# Patient Record
Sex: Male | Born: 1970 | Race: Black or African American | Hispanic: No | Smoking: Never smoker
Health system: Southern US, Community
[De-identification: ages and names within clinical notes are randomized; demographics above are authoritative.]

## PROBLEM LIST (undated history)

## (undated) DIAGNOSIS — M109 Gout, unspecified: Secondary | ICD-10-CM

## (undated) DIAGNOSIS — I10 Essential (primary) hypertension: Secondary | ICD-10-CM

---

## 2005-12-24 ENCOUNTER — Emergency Department: Payer: Self-pay | Admitting: Emergency Medicine

## 2006-12-20 ENCOUNTER — Emergency Department: Payer: Self-pay

## 2007-06-16 ENCOUNTER — Emergency Department: Payer: Self-pay | Admitting: Emergency Medicine

## 2009-02-17 ENCOUNTER — Emergency Department: Payer: Self-pay | Admitting: Emergency Medicine

## 2009-06-03 ENCOUNTER — Emergency Department: Payer: Self-pay | Admitting: Emergency Medicine

## 2009-09-04 ENCOUNTER — Emergency Department: Payer: Self-pay | Admitting: Emergency Medicine

## 2009-10-09 ENCOUNTER — Emergency Department: Payer: Self-pay | Admitting: Emergency Medicine

## 2009-11-12 ENCOUNTER — Ambulatory Visit: Payer: Self-pay | Admitting: Orthopedic Surgery

## 2009-11-18 ENCOUNTER — Ambulatory Visit: Payer: Self-pay | Admitting: Orthopedic Surgery

## 2012-01-24 ENCOUNTER — Emergency Department: Payer: Self-pay | Admitting: Emergency Medicine

## 2013-04-14 ENCOUNTER — Emergency Department: Payer: Self-pay | Admitting: Emergency Medicine

## 2013-04-14 LAB — CBC
HCT: 40.5 % (ref 40.0–52.0)
HGB: 13.9 g/dL (ref 13.0–18.0)
MCH: 30.5 pg (ref 26.0–34.0)
MCHC: 34.4 g/dL (ref 32.0–36.0)
RBC: 4.57 10*6/uL (ref 4.40–5.90)
RDW: 13.9 % (ref 11.5–14.5)
WBC: 8.9 10*3/uL (ref 3.8–10.6)

## 2013-04-14 LAB — URINALYSIS, COMPLETE
Bacteria: NONE SEEN
Bilirubin,UR: NEGATIVE
Blood: NEGATIVE
Ketone: NEGATIVE
Protein: NEGATIVE
Specific Gravity: 1.024 (ref 1.003–1.030)
Squamous Epithelial: <1

## 2013-04-15 LAB — COMPREHENSIVE METABOLIC PANEL
Albumin: 3.7 g/dL (ref 3.4–5.0)
Alkaline Phosphatase: 89 U/L (ref 50–136)
Anion Gap: 6 — ABNORMAL LOW (ref 7–16)
EGFR (Non-African Amer.): >60
SGOT(AST): 26 U/L (ref 15–37)
SGPT (ALT): 51 U/L (ref 12–78)

## 2013-04-15 LAB — TROPONIN I: Troponin-I: <0.02 ng/mL

## 2013-04-15 LAB — CK TOTAL AND CKMB (NOT AT ARMC): CK-MB: 0.7 ng/mL (ref 0.5–3.6)

## 2013-04-15 LAB — LIPASE, BLOOD: Lipase: 187 U/L (ref 73–393)

## 2013-06-26 ENCOUNTER — Emergency Department: Payer: Self-pay | Admitting: Emergency Medicine

## 2014-07-06 ENCOUNTER — Emergency Department: Payer: Self-pay | Admitting: Emergency Medicine

## 2014-08-05 ENCOUNTER — Emergency Department: Payer: Self-pay | Admitting: Emergency Medicine

## 2015-01-30 ENCOUNTER — Emergency Department: Payer: Self-pay | Admitting: Emergency Medicine

## 2015-03-03 ENCOUNTER — Emergency Department
Admit: 2015-03-03 | Disposition: A | Discharge status: Home / Self Care | Payer: Self-pay | Admitting: Emergency Medicine

## 2016-01-15 ENCOUNTER — Emergency Department
Admission: EM | Admit: 2016-01-15 | Discharge: 2016-01-15 | Disposition: A | Discharge status: Home / Self Care | Payer: Self-pay | Attending: Emergency Medicine | Admitting: Emergency Medicine

## 2016-01-15 DIAGNOSIS — M109 Gout, unspecified: Secondary | ICD-10-CM

## 2016-01-15 DIAGNOSIS — M10072 Idiopathic gout, left ankle and foot: Secondary | ICD-10-CM | POA: Insufficient documentation

## 2016-01-15 MED ORDER — KETOROLAC TROMETHAMINE 60 MG/2ML IM SOLN
60.0000 mg | Freq: Once | INTRAMUSCULAR | Status: AC
  Administered 2016-01-15: 60 mg via INTRAMUSCULAR

## 2016-01-15 MED ORDER — KETOROLAC TROMETHAMINE 60 MG/2ML IM SOLN
INTRAMUSCULAR | Status: AC
  Filled 2016-01-15: qty 2

## 2016-01-15 MED ORDER — COLCHICINE 0.6 MG PO TABS: 0.6000 mg | ORAL_TABLET | Freq: Every day | ORAL | Status: DC

## 2016-01-15 NOTE — Discharge Instructions (Signed)

## 2016-01-15 NOTE — ED Provider Notes (Signed)
[facility] Emergency Department Provider Note  ____________________________________________  Time seen: Approximately 11:22 PM  I have reviewed the triage vital signs and the nursing notes.   HISTORY  Chief Complaint Foot Pain    HPI Tony Carlson is a 45 y.o. male who presents emergency department complaining of left ankle/foot pain. Patient states that he has a history of gout and states that symptoms are the same. Patient denies any recent injury. Patient denies any knee Pain. He denies any difficulty breathing or chest pain. Patient denies any history of kidney pounds.   No past medical history on file.  There are no active problems to display for this patient.   No past surgical history on file.  Current Outpatient Rx  Name  Route  Sig  Dispense  Refill  . colchicine 0.6 MG tablet   Oral   Take 1 tablet (0.6 mg total) by mouth daily. Take 2 tablets first day, 1 hour later take 1 more tab for a total of 3 tabs on the 1st day Take 1 tab daily for at least 6 more days. If  Symptoms persist past 6 days continue to use until prescription is finished   20 tablet   0     Allergies Review of patient's allergies indicates no known allergies.  No family history on file.  Social History Social History  Substance Use Topics  . Smoking status: Not on file  . Smokeless tobacco: Not on file  . Alcohol Use: Not on file     Review of Systems  Constitutional: No fever/chills Cardiovascular: no chest pain. Respiratory: no cough. No SOB. Musculoskeletal: Negative for back pain. Positive for left ankle pain. Skin: Negative for rash. Neurological: Negative for headaches, focal weakness or numbness. 10-point ROS otherwise negative.  ____________________________________________   PHYSICAL EXAM:  VITAL SIGNS: ED Triage Vitals  Enc Vitals Group     BP 01/15/16 2117 157/97 mmHg     Pulse Rate 01/15/16 2117 108     Resp 01/15/16 2117 18    Temp 01/15/16 2117 97.7 F (36.5 C)     Temp Source 01/15/16 2117 Oral     SpO2 01/15/16 2117 97 %     Weight 01/15/16 2117 250 lb (113.399 kg)     Height 01/15/16 2117  (1.778 m)     Head Cir --      Peak Flow --      Pain Score 01/15/16 2117 7     Pain Loc --      Pain Edu? --      Excl. in GC? --      Constitutional: Alert and oriented. Well appearing and in no acute distress. Eyes: Conjunctivae are normal. PERRL. EOMI. Head: Atraumatic. Cardiovascular: Normal rate, regular rhythm. Normal S1 and S2.  Good peripheral circulation. Respiratory: Normal respiratory effort without tachypnea or retractions. Lungs CTAB. Musculoskeletal: Edematous left ankle upon inspection. Area is not erythematous. It is warm to the touch. No palpable abnormality. Tenderness to palpation diffusely over the ankle. Limited range of motion due to pain. Dorsalis pedis pulses appreciated. Sensation intact 5 digits. Neurologic:  Normal speech and language. No gross focal neurologic deficits are appreciated.  Skin:  Skin is warm, dry and intact. No rash noted. Psychiatric: Mood and affect are normal. Speech and behavior are normal. Patient exhibits appropriate insight and judgement.   ____________________________________________   LABS (all labs ordered are listed, but only abnormal results are displayed)  Labs Reviewed - No  data to display ____________________________________________  EKG   ____________________________________________  RADIOLOGY   No results found.  ____________________________________________    PROCEDURES  Procedure(s) performed:       Medications  ketorolac (TORADOL) injection 60 mg (60 mg Intramuscular Given 01/15/16 2328)     ____________________________________________   INITIAL IMPRESSION / ASSESSMENT AND PLAN / ED COURSE  Pertinent labs & imaging results that were available during my care of the patient were reviewed by me and considered in my  medical decision making (see chart for details).  Patient's diagnosis is consistent with gouty arthritis. Patient presents with a known history of gout and states that symptoms are exactly the same as previous flares. Patient is offered uric acid testing as well as x-ray and patient declines at this time. Patient is given a shot of Toradol here in the emergency department and discharged home with anti-inflammatories for symptom control. Patient will follow-up with his primary care provider for any symptoms persisting past this treatment course.  Patient is given ED precautions to return to the ED for any worsening or new symptoms.     ____________________________________________  FINAL CLINICAL IMPRESSION(S) / ED DIAGNOSES  Final diagnoses:  Acute gout of left ankle, unspecified cause      NEW MEDICATIONS STARTED DURING THIS VISIT:  New Prescriptions   COLCHICINE 0.6 MG TABLET    Take 1 tablet (0.6 mg total) by mouth daily. Take 2 tablets first day, 1 hour later take 1 more tab for a total of 3 tabs on the 1st day Take 1 tab daily for at least 6 more days. If  Symptoms persist past 6 days continue to use until prescription is finished        This chart was dictated using voice recognition software/Dragon. Despite best efforts to proofread, errors can occur which can change the meaning. Any change was purely unintentional.    Racheal PatchesJonathan D Cuthriell, PA-C 01/15/16 2332  Jennye MoccasinBrian S Quigley, MD 01/15/16 [phone]

## 2016-01-15 NOTE — ED Notes (Signed)
Pt placed on med hold at this time for 15 mins, pt made aware and verbalized understanding at this time.

## 2016-01-15 NOTE — ED Notes (Signed)
Pt in with co left foot pain has hx of gout states feels the same.

## 2016-05-26 ENCOUNTER — Emergency Department
Admission: EM | Admit: 2016-05-26 | Discharge: 2016-05-26 | Disposition: A | Discharge status: Home / Self Care | Payer: Self-pay | Attending: Student | Admitting: Student

## 2016-05-26 ENCOUNTER — Encounter: Payer: Self-pay | Admitting: Emergency Medicine

## 2016-05-26 ENCOUNTER — Emergency Department: Payer: Self-pay

## 2016-05-26 DIAGNOSIS — S8002XA Contusion of left knee, initial encounter: Secondary | ICD-10-CM

## 2016-05-26 DIAGNOSIS — S8392XA Sprain of unspecified site of left knee, initial encounter: Secondary | ICD-10-CM | POA: Insufficient documentation

## 2016-05-26 DIAGNOSIS — Y929 Unspecified place or not applicable: Secondary | ICD-10-CM | POA: Insufficient documentation

## 2016-05-26 DIAGNOSIS — Y999 Unspecified external cause status: Secondary | ICD-10-CM | POA: Insufficient documentation

## 2016-05-26 DIAGNOSIS — Y939 Activity, unspecified: Secondary | ICD-10-CM | POA: Insufficient documentation

## 2016-05-26 DIAGNOSIS — W109XXA Fall (on) (from) unspecified stairs and steps, initial encounter: Secondary | ICD-10-CM | POA: Insufficient documentation

## 2016-05-26 MED ORDER — NAPROXEN 500 MG PO TBEC: 500.0000 mg | DELAYED_RELEASE_TABLET | Freq: Two times a day (BID) | ORAL | Status: DC

## 2016-05-26 NOTE — ED Notes (Signed)
Pt presents with left knee pain after falling this past weekend. Pt ambulated to triage with no difficulty noted.

## 2016-05-26 NOTE — ED Provider Notes (Signed)
[facility] Emergency Department Provider Note ____________________________________________  Time seen: 1545  I have reviewed the triage vital signs and the nursing notes.  HISTORY  Chief Complaint  Knee Pain  HPI Tony Carlson is a 45 y.o. male sensitivity ED for evaluation of medial left knee pain [place]. The patient describes that 3 days prior to arrival he was wearing flip flops and rainy weather, when he evidently slipped at the hotel going up steps. He describes falling, twisting his left knee, and hitting the inside of the knee on the concrete steps. He describes pain and swelling to the knee medially, since that time. He denies any catch, click, LOC, or give way. He does note increased pain to the medial knee with transitioning from sit to stand. He has applied some ice intermittently since the accident. He however has not taken any medication for pain relief. He describes overall pain at a 7/10 in triage. He denies any other injury at this time.  History reviewed. No pertinent past medical history.  There are no active problems to display for this patient.   History reviewed. No pertinent past surgical history.  Current Outpatient Rx  Name  Route  Sig  Dispense  Refill  . colchicine 0.6 MG tablet   Oral   Take 1 tablet (0.6 mg total) by mouth daily. Take 2 tablets first day, 1 hour later take 1 more tab for a total of 3 tabs on the 1st day Take 1 tab daily for at least 6 more days. If  Symptoms persist past 6 days continue to use until prescription is finished   20 tablet   0   . naproxen (EC NAPROSYN) 500 MG EC tablet   Oral   Take 1 tablet (500 mg total) by mouth 2 (two) times daily with a meal.   30 tablet   0     Allergies Review of patient's allergies indicates no known allergies.  No family history on file.  Social History Social History  Substance Use Topics  . Smoking status: Never Smoker    . Smokeless tobacco: None  . Alcohol Use: Yes   Review of Systems  Constitutional: Negative for fever. Musculoskeletal: Negative for back pain. Left knee pain as above.  Skin: Negative for rash. Neurological: Negative for headaches, focal weakness or numbness. ____________________________________________  PHYSICAL EXAM:  VITAL SIGNS: ED Triage Vitals  Enc Vitals Group     BP 05/26/16 1511 165/99 mmHg     Pulse Rate 05/26/16 1511 109     Resp 05/26/16 1511 20     Temp 05/26/16 1511 98.1 F (36.7 C)     Temp Source 05/26/16 1511 Oral     SpO2 05/26/16 1511 97 %     Weight 05/26/16 1511 240 lb (108.863 kg)     Height 05/26/16 1511  (1.778 m)     Head Cir --      Peak Flow --      Pain Score 05/26/16 1512 7     Pain Loc --      Pain Edu? --      Excl. in GC? --    Constitutional: Alert and oriented. Well appearing and in no distress. Head: Normocephalic and atraumatic. Cardiovascular: Normal distal pulses.  Respiratory: Normal respiratory effort. No wheezes/rales/rhonchi. Musculoskeletal: Left knee without obvious deformity, effusion, or dislocation. Medial knee tenderness. No valgus or varus joint laxity. Negative anterior/posterior drawer. No popliteal space  fullness. No calf/achillles tenderness. Nontender with normal range of motion in all extremities.  Neurologic:  Mildly antalgic gait without ataxia. Normal speech and language. No gross focal neurologic deficits are appreciated. Skin:  Skin is warm, dry and intact. No rash noted. ____________________________________________   RADIOLOGY  Left Knee IMPRESSION: Normal left knee. ____________________________________________  PROCEDURES  Ace bandage ____________________________________________  INITIAL IMPRESSION / ASSESSMENT AND PLAN / ED COURSE  A short with what appears to be a medial knee sprain and contusion. No outright internal derangement is appreciated on exam. Patient will be fitted with an Ace  bandage for support. He is discharged with a prescription for EC Naprosyn to dose as directed. He should apply ice and rest and elevate if needed. He will follow-up with orthopedics for ongoing symptom management. ____________________________________________  FINAL CLINICAL IMPRESSION(S) / ED DIAGNOSES  Final diagnoses:  Knee sprain and strain, left, initial encounter  Knee contusion, left, initial encounter     Lissa HoardJenise V Bacon Sherelle Castelli, PA-C 05/27/16 0009  Gayla DossEryka A Gayle, MD 05/27/16 1556

## 2016-05-26 NOTE — Discharge Instructions (Signed)
Your knee exam and x-ray do not show a fracture or dislocation to the left knee. You likely have a sprain and bruise to the bone from your injury. Wear the knee brace for support. Apply ice to reduce pain and swelling. Take the prescription anti-inflammatory as directed. Follow-up with Dr. Ernest PineHooten for continued symptoms.   Knee Sprain A knee sprain is a tear in one of the strong, fibrous tissues that connect the bones (ligaments) in your knee. The severity of the sprain depends on how much of the ligament is torn. The tear can be either partial or complete. CAUSES  Often, sprains are a result of a fall or injury. The force of the impact causes the fibers of your ligament to stretch too much. This excess tension causes the fibers of your ligament to tear. SIGNS AND SYMPTOMS  You may have some loss of motion in your knee. Other symptoms include:  Bruising.  Pain in the knee area.  Tenderness of the knee to the touch.  Swelling. DIAGNOSIS  To diagnose a knee sprain, your health care provider will physically examine your knee. Your health care provider may also suggest an X-ray exam of your knee to make sure no bones are broken. TREATMENT  If your ligament is only partially torn, treatment usually involves keeping the knee in a fixed position (immobilization) or bracing your knee for activities that require movement for several weeks. To do this, your health care provider will apply a bandage, cast, or splint to keep your knee from moving and to support your knee during movement until it heals. For a partially torn ligament, the healing process usually takes 4-6 weeks. If your ligament is completely torn, depending on which ligament it is, you may need surgery to reconnect the ligament to the bone or reconstruct it. After surgery, a cast or splint may be applied and will need to stay on your knee for 4-6 weeks while your ligament heals. HOME CARE INSTRUCTIONS  Keep your injured knee elevated to  decrease swelling.  To ease pain and swelling, apply ice to the injured area:  Put ice in a plastic bag.  Place a towel between your skin and the bag.  Leave the ice on for 20 minutes, 2-3 times a day.  Only take medicine for pain as directed by your health care provider.  Do not leave your knee unprotected until pain and stiffness go away (usually 4-6 weeks).  If you have a cast or splint, do not allow it to get wet. If you have been instructed not to remove it, cover it with a plastic bag when you shower or bathe. Do not swim.  Your health care provider may suggest exercises for you to do during your recovery to prevent or limit permanent weakness and stiffness. SEEK IMMEDIATE MEDICAL CARE IF:  Your cast or splint becomes damaged.  Your pain becomes worse.  You have significant pain, swelling, or numbness below the cast or splint. MAKE SURE YOU:  Understand these instructions.  Will watch your condition.  Will get help right away if you are not doing well or get worse.   This information is not intended to replace advice given to you by your health care provider. Make sure you discuss any questions you have with your health care provider.   Document Released: 10/26/2005 Document Revised: 11/16/2014 Document Reviewed: 06/07/2013 Elsevier Interactive Patient Education 2016 [employer]  Periosteal Hematoma Periosteal hematoma (bone bruise) is a localized, tender, raised area close to  the bone. It can occur from a small hidden fracture of the bone, following surgery, or from other trauma to the area. It typically occurs in bones located close to the surface of the skin, such as the shin, knee, and heel bone. Although it may take 2 or more weeks to completely heal, bone bruises typically are not associated with permanent or serious damage to the bone. If you are taking blood thinners, you may be at greater risk for such injuries.  CAUSES  A bone bruise is usually caused by  high-impact trauma to the bone, but it can be caused by sports injuries or twisting injuries. SIGNS AND SYMPTOMS   Severe pain around the injured area that typically lasts longer than a normal bruise.  Difficulty using the bruised area.  Tender, raised area close to the bone.  Discoloration or swelling of the bruised area. DIAGNOSIS  You may need an MRI of the injured area to confirm a bone bruise if your health care provider feels it is necessary. A regular X-ray will not detect a bone bruise, but it will detect a broken bone (fracture). An X-ray may be taken to rule out any fractures. TREATMENT  Often, the best treatment for a bone bruise is resting, icing, and applying cold compresses to the injured area. Over-the-counter medicines may also be recommended for pain control. HOME CARE INSTRUCTIONS  Some things you can do to improve the condition are:   Rest and elevate the area of injury as long as it is very tender or swollen.  Apply ice to the injured area:  Put ice in a plastic bag.  Place a towel between your skin and the bag.  Leave the ice on for 20 minutes, 2-3 times a day.  Use an elastic wrap to reduce swelling and protect the injured area. Make sure it is not applied too tightly. If the area around the wrap becomes cold or blue, the wrap is too tight. Wrap it more loosely.  For activity:  Follow your health care provider's instructions about whether walking with crutches is required. This will depend on how serious your condition is.  Start weight bearing gradually on the bruised part.  Continue to use crutches or a cane until you can stand without causing pain, or as instructed.  If a plaster splint was applied:  Wear the splint until you are seen for a follow-up exam.  Rest it on nothing harder than a pillow the first 24 hours.  Do not put weight on it.  Do not get it wet. You may take it off to take a shower or bath.  You may have been given an elastic  bandage to use with or without the plaster splint. The splint is too tight if you have numbness or tingling, or if the skin around the bandage becomes cold and blue. Adjust the bandage to make it comfortable.  If an air splint was applied:  You may alter the amount of air in the splint as needed for comfort.  You may take it off at night and to take a shower or bath.  If the injury was in either leg, wiggle your toes in the splint several times per day if you are able.  Only take over-the-counter or prescription medicines for pain, discomfort, or fever as directed by your health care provider.  Keep all follow-up visits with your health care provider. This includes any orthopedic referrals, physical therapy, and rehabilitation. Any delay in getting necessary care  could result in a delay or failure of the bones to heal. SEEK MEDICAL CARE IF:   You have an increase in bruising, swelling, tenderness, heat, or pain over your injury.  You notice coldness of your toes that does not improve after removing a splint or bandage.  Your pain is not lessened after you take medicine.  You have increased difficulty bearing weight on the injured leg, if the injury is in either leg. SEEK IMMEDIATE MEDICAL CARE IF:   You have severe pain near the injured area or severe pain with stretching.  You have increased swelling that resulted in a tense, hard area or a loss of sensation in the area of the injury.  You have pale, cool skin below the area of the injury (in an extremity) that does not go away after removing a splint or bandage. MAKE SURE YOU:   Understand these instructions.  Will watch your condition.  Will get help right away if you are not doing well or get worse.   This information is not intended to replace advice given to you by your health care provider. Make sure you discuss any questions you have with your health care provider.   Document Released: 12/03/2004 Document Revised:  08/16/2013 Document Reviewed: 04/14/2013 Elsevier Interactive Patient Education [employer]

## 2016-10-28 ENCOUNTER — Encounter: Payer: Self-pay | Admitting: *Deleted

## 2016-10-28 ENCOUNTER — Emergency Department
Admission: EM | Admit: 2016-10-28 | Discharge: 2016-10-28 | Disposition: A | Discharge status: Home / Self Care | Payer: No Typology Code available for payment source | Attending: Emergency Medicine | Admitting: Emergency Medicine

## 2016-10-28 DIAGNOSIS — M10072 Idiopathic gout, left ankle and foot: Secondary | ICD-10-CM | POA: Diagnosis not present

## 2016-10-28 DIAGNOSIS — M25572 Pain in left ankle and joints of left foot: Secondary | ICD-10-CM | POA: Diagnosis present

## 2016-10-28 DIAGNOSIS — M109 Gout, unspecified: Secondary | ICD-10-CM

## 2016-10-28 MED ORDER — ALLOPURINOL 100 MG PO TABS: 100.0000 mg | ORAL_TABLET | Freq: Every day | ORAL | 0 refills | Status: DC

## 2016-10-28 NOTE — ED Provider Notes (Signed)
[facility] Emergency Department Provider Note  ____________________________________________  Time seen: Approximately 4:53 PM  I have reviewed the triage vital signs and the nursing notes.   HISTORY  Chief Complaint Foot Pain    HPI Tony Carlson is a 45 y.o. male who presents to emergency department complaining of gout to the left ankle. Patient has a history of reoccurring gout. Patient states that he had a few leftover pills from a previous prescription of cultures in that he is taking. These are less than symptoms somewhat but not fully alleviated all symptoms. Patient states that he typically likes allopurinol versus colchicine and is requesting follow-up urine on this time. No other complaints at this time. Pain is sharp, burning to the left ankle with mild edema. No definitive injury.   History reviewed. No pertinent past medical history.  There are no active problems to display for this patient.   History reviewed. No pertinent surgical history.  Prior to Admission medications   Medication Sig Start Date End Date Taking? Authorizing Provider  allopurinol (ZYLOPRIM) 100 MG tablet Take 1 tablet (100 mg total) by mouth daily. May increase to 2 tablets daily after 1 week if symptoms have not resolved. 10/28/16 10/28/17  Christiane HaJonathan D Tateanna Bach, PA-C  colchicine 0.6 MG tablet Take 1 tablet (0.6 mg total) by mouth daily. Take 2 tablets first day, 1 hour later take 1 more tab for a total of 3 tabs on the 1st day Take 1 tab daily for at least 6 more days. If  Symptoms persist past 6 days continue to use until prescription is finished 01/15/16   Delorise RoyalsJonathan D Tylor Gambrill, PA-C  naproxen (EC NAPROSYN) 500 MG EC tablet Take 1 tablet (500 mg total) by mouth 2 (two) times daily with a meal. 05/26/16   Charlesetta IvoryJenise V Bacon Menshew, PA-C    Allergies Patient has no known allergies.  History reviewed. No pertinent family history.  Social History Social History  Substance  Use Topics  . Smoking status: Never Smoker  . Smokeless tobacco: Not on file  . Alcohol use Yes     Review of Systems  Constitutional: No fever/chills Cardiovascular: no chest pain. Respiratory: no cough. No SOB. Musculoskeletal: Left ankle pain Skin: Negative for rash, abrasions, lacerations, ecchymosis. Neurological: Negative for headaches, focal weakness or numbness. 10-point ROS otherwise negative.  ____________________________________________   PHYSICAL EXAM:  VITAL SIGNS: ED Triage Vitals [10/28/16 1644]  Enc Vitals Group     BP (!) 145/78     Pulse Rate (!) 102     Resp 18     Temp 97.9 F (36.6 C)     Temp Source Oral     SpO2 97 %     Weight 240 lb (108.9 kg)     Height 5\' 10"  (1.778 m)     Head Circumference      Peak Flow      Pain Score 5     Pain Loc      Pain Edu?      Excl. in GC?      Constitutional: Alert and oriented. Well appearing and in no acute distress. Eyes: Conjunctivae are normal. PERRL. EOMI. Head: Atraumatic. Neck: No stridor.    Cardiovascular: Normal rate, regular rhythm. Normal S1 and S2.  Good peripheral circulation. Respiratory: Normal respiratory effort without tachypnea or retractions. Lungs CTAB. Good air entry to the bases with no decreased or absent breath sounds. Musculoskeletal: Full range of motion to all extremities. No gross deformities  appreciated. Neurologic:  Normal speech and language. No gross focal neurologic deficits are appreciated. Mild edema noted to left ankle upon inspection. Mild warmth to palpation. No palpable abnormalities. Full range of motion of ankle. Dorsalis pedis pulse intact. Sensation intact all 5 digits. Skin:  Skin is warm, dry and intact. No rash noted. Psychiatric: Mood and affect are normal. Speech and behavior are normal. Patient exhibits appropriate insight and judgement.   ____________________________________________   LABS (all labs ordered are listed, but only abnormal results are  displayed)  Labs Reviewed - No data to display ____________________________________________  EKG   ____________________________________________  RADIOLOGY   No results found.  ____________________________________________    PROCEDURES  Procedure(s) performed:    Procedures    Medications - No data to display   ____________________________________________   INITIAL IMPRESSION / ASSESSMENT AND PLAN / ED COURSE  Pertinent labs & imaging results that were available during my care of the patient were reviewed by me and considered in my medical decision making (see chart for details).  Review of the [place] CSRS was performed in accordance of the [facility] prior to dispensing any controlled drugs.  Clinical Course     Patient's diagnosis is consistent with Gout to the left ankle. Patient is offered uric acid testing and x-ray at this time for further evaluation and patient declines. Patient states that the symptoms are consistent with previous scalp. Patient is requesting allopurinol as he states this works better than colchicine for him.. Patient will be discharged home with prescriptions for allopurinol. Patient is to follow up with primary care as needed or otherwise directed. Patient is given ED precautions to return to the ED for any worsening or new symptoms.     ____________________________________________  FINAL CLINICAL IMPRESSION(S) / ED DIAGNOSES  Final diagnoses:  Acute gout of left ankle, unspecified cause      NEW MEDICATIONS STARTED DURING THIS VISIT:  Discharge Medication List as of 10/28/2016  5:02 PM    START taking these medications   Details  allopurinol (ZYLOPRIM) 100 MG tablet Take 1 tablet (100 mg total) by mouth daily. May increase to 2 tablets daily after 1 week if symptoms have not resolved., Starting Wed 10/28/2016, Until Thu 10/28/2017, Print            This chart was dictated using voice recognition software/Dragon. Despite best  efforts to proofread, errors can occur which can change the meaning. Any change was purely unintentional.    Racheal PatchesJonathan D Orlena Garmon, PA-C 10/28/16 1707    Loleta Roseory Forbach, MD 10/28/16 1820

## 2016-10-28 NOTE — ED Triage Notes (Signed)
Pt arrives with complaints of left foot pain, denies any injury, states hx of gout

## 2017-03-01 ENCOUNTER — Emergency Department
Admission: EM | Admit: 2017-03-01 | Discharge: 2017-03-01 | Disposition: A | Discharge status: Home / Self Care | Payer: No Typology Code available for payment source | Attending: Emergency Medicine | Admitting: Emergency Medicine

## 2017-03-01 ENCOUNTER — Encounter: Payer: Self-pay | Admitting: Emergency Medicine

## 2017-03-01 ENCOUNTER — Emergency Department: Payer: No Typology Code available for payment source

## 2017-03-01 DIAGNOSIS — M109 Gout, unspecified: Secondary | ICD-10-CM

## 2017-03-01 DIAGNOSIS — M10061 Idiopathic gout, right knee: Secondary | ICD-10-CM | POA: Insufficient documentation

## 2017-03-01 DIAGNOSIS — M25561 Pain in right knee: Secondary | ICD-10-CM | POA: Diagnosis present

## 2017-03-01 LAB — COMPREHENSIVE METABOLIC PANEL
ALT: 32 U/L (ref 17–63)
AST: 23 U/L (ref 15–41)
Albumin: 4.5 g/dL (ref 3.5–5.0)
Alkaline Phosphatase: 75 U/L (ref 38–126)
Anion gap: 7 (ref 5–15)
BUN: 18 mg/dL (ref 6–20)
CO2: 27 mmol/L (ref 22–32)
Calcium: 9.8 mg/dL (ref 8.9–10.3)
Chloride: 102 mmol/L (ref 101–111)
Creatinine, Ser: 1.03 mg/dL (ref 0.61–1.24)
GFR calc Af Amer: >60 mL/min (ref >60)
GFR calc non Af Amer: >60 mL/min (ref >60)
Glucose, Bld: 107 mg/dL — ABNORMAL HIGH (ref 65–99)
Potassium: 4 mmol/L (ref 3.5–5.1)
Sodium: 136 mmol/L (ref 135–145)
Total Bilirubin: 0.5 mg/dL (ref 0.3–1.2)
Total Protein: 8.8 g/dL — ABNORMAL HIGH (ref 6.5–8.1)

## 2017-03-01 LAB — CBC
HCT: 46.3 % (ref 40.0–52.0)
HEMOGLOBIN: 15.9 g/dL (ref 13.0–18.0)
MCH: 30.3 pg (ref 26.0–34.0)
MCHC: 34.3 g/dL (ref 32.0–36.0)
MCV: 88.4 fL (ref 80.0–100.0)
Platelets: 286 10*3/uL (ref 150–440)
RBC: 5.24 MIL/uL (ref 4.40–5.90)
RDW: 13.5 % (ref 11.5–14.5)
WBC: 12.4 10*3/uL — ABNORMAL HIGH (ref 3.8–10.6)

## 2017-03-01 LAB — URIC ACID: Uric Acid, Serum: 7.6 mg/dL (ref 4.4–7.6)

## 2017-03-01 LAB — C-REACTIVE PROTEIN: CRP: 7.6 mg/dL — ABNORMAL HIGH (ref <1.0)

## 2017-03-01 LAB — SEDIMENTATION RATE: Sed Rate: 11 mm/hr (ref 0–15)

## 2017-03-01 MED ORDER — HYDROCODONE-ACETAMINOPHEN 5-325 MG PO TABS: 1.0000 | ORAL_TABLET | Freq: Four times a day (QID) | ORAL | 0 refills | Status: DC | PRN

## 2017-03-01 MED ORDER — INDOMETHACIN 25 MG PO CAPS: 25.0000 mg | ORAL_CAPSULE | Freq: Two times a day (BID) | ORAL | 0 refills | Status: DC

## 2017-03-01 NOTE — ED Provider Notes (Signed)
[facility] Emergency Department Provider Note  ____________________________________________  Time seen: Approximately 4:05 PM  I have reviewed the triage vital signs and the nursing notes.   HISTORY  Chief Complaint Knee Pain    HPI ANTONEO GHRIST is a 46 y.o. male that presents to emergency department with 3 days of right knee pain. Patient states that overnight knee became hot and swollen. No injury. He has a history of gout and is supposed to take Medicine daily but has not been. He started taking colchicine, which has not been working. He denies any recent illness. No injuries. No IV drug use. He has never had surgery on that knee. He has been taking ibuprofen for pain. He denies fever, shortness of breath, chest pain, nausea, vomiting, abdominal pain.   History reviewed. No pertinent past medical history.  There are no active problems to display for this patient.   History reviewed. No pertinent surgical history.  Prior to Admission medications   Medication Sig Start Date End Date Taking? Authorizing Provider  allopurinol (ZYLOPRIM) 100 MG tablet Take 1 tablet (100 mg total) by mouth daily. May increase to 2 tablets daily after 1 week if symptoms have not resolved. 10/28/16 10/28/17  Christiane Ha D Cuthriell, PA-C  colchicine 0.6 MG tablet Take 1 tablet (0.6 mg total) by mouth daily. Take 2 tablets first day, 1 hour later take 1 more tab for a total of 3 tabs on the 1st day Take 1 tab daily for at least 6 more days. If  Symptoms persist past 6 days continue to use until prescription is finished 01/15/16   Delorise Royals Cuthriell, PA-C  HYDROcodone-acetaminophen (NORCO/VICODIN) 5-325 MG tablet Take 1 tablet by mouth every 6 (six) hours as needed for moderate pain. 03/01/17   Enid Derry, PA-C  indomethacin (INDOCIN) 25 MG capsule Take 1 capsule (25 mg total) by mouth 2 (two) times daily with a meal. 03/01/17   Enid Derry, PA-C  naproxen (EC NAPROSYN) 500 MG  EC tablet Take 1 tablet (500 mg total) by mouth 2 (two) times daily with a meal. 05/26/16   Charlesetta Ivory Menshew, PA-C    Allergies Patient has no known allergies.  No family history on file.  Social History Social History  Substance Use Topics  . Smoking status: Never Smoker  . Smokeless tobacco: Not on file  . Alcohol use Yes     Review of Systems  Constitutional: No fever/chills ENT: No upper respiratory complaints. Cardiovascular: No chest pain. Respiratory: No SOB. Gastrointestinal: No abdominal pain.  No nausea, no vomiting.  Musculoskeletal: Positive for right knee pain. Skin: Negative for rash, abrasions, lacerations, ecchymosis. Neurological: Negative for headaches, numbness or tingling   ____________________________________________   PHYSICAL EXAM:  VITAL SIGNS: ED Triage Vitals  Enc Vitals Group     BP 03/01/17 1409 (!) 198/98     Pulse Rate 03/01/17 1409 94     Resp 03/01/17 1409 18     Temp 03/01/17 1409 97.5 F (36.4 C)     Temp Source 03/01/17 1409 Oral     SpO2 03/01/17 1409 94 %     Weight 03/01/17 1409 240 lb (108.9 kg)     Height 03/01/17 1409  (1.778 m)     Head Circumference --      Peak Flow --      Pain Score 03/01/17 1408 10     Pain Loc --      Pain Edu? --  Excl. in GC? --      Constitutional: Alert and oriented. Well appearing and in no acute distress. Eyes: Conjunctivae are normal. PERRL. EOMI. Head: Atraumatic. ENT:      Ears:      Nose: No congestion/rhinnorhea.      Mouth/Throat: Mucous membranes are moist.  Neck: No stridor.   Cardiovascular: Normal rate, regular rhythm.  Good peripheral circulation. Respiratory: Normal respiratory effort without tachypnea or retractions. Lungs CTAB. Good air entry to the bases with no decreased or absent breath sounds. Gastrointestinal: Bowel sounds 4 quadrants. Soft and nontender to palpation. No guarding or rigidity. No palpable masses. No distention.  Musculoskeletal:  Full range of motion to all extremities. No gross deformities appreciated. Tenderness to palpation diffusely over right knee. Knee is warm to touch. No erythema. Patient has full range of motion of knee but has pain. Neurologic:  Normal speech and language. No gross focal neurologic deficits are appreciated.  Skin:  Skin is warm, dry and intact.    ____________________________________________   LABS (all labs ordered are listed, but only abnormal results are displayed)  Labs Reviewed  CBC - Abnormal; Notable for the following:       Result Value   WBC 12.4 (*)    All other components within normal limits  COMPREHENSIVE METABOLIC PANEL - Abnormal; Notable for the following:    Glucose, Bld 107 (*)    Total Protein 8.8 (*)    All other components within normal limits  URIC ACID  SEDIMENTATION RATE  C-REACTIVE PROTEIN   ____________________________________________  EKG   ____________________________________________  RADIOLOGY Lexine Baton, personally viewed and evaluated these images (plain radiographs) as part of my medical decision making, as well as reviewing the written report by the radiologist.  Dg Knee 2 Views Right  Result Date: 03/01/2017 CLINICAL DATA:  RIGHT knee pain for 4 days, difficulty bearing weight, denies injury EXAM: RIGHT KNEE - 1-2 VIEW COMPARISON:  None FINDINGS: Osseous mineralization normal. Joint spaces preserved. No fracture, dislocation, or bone destruction. No joint effusion. Mild regional soft tissue swelling. IMPRESSION: Mild soft tissue swelling without acute bony abnormalities. Electronically Signed   By: Ulyses Southward M.D.   On: 03/01/2017 16:02    ____________________________________________    PROCEDURES  Procedure(s) performed:    Procedures    Medications - No data to display   ____________________________________________   INITIAL IMPRESSION / ASSESSMENT AND PLAN / ED COURSE  Pertinent labs & imaging results that were  available during my care of the patient were reviewed by me and considered in my medical decision making (see chart for details).  Review of the [place] CSRS was performed in accordance of the [facility] prior to dispensing any controlled drugs.  She presented to the emergency department with knee pain. I suspect gout. Vital signs and exam are reassuring. X-ray negative for acute bony abnormalities. No trauma. White blood cell count mildly elevated. Uric acid at the upper end of normal. No indication of septic joint. Patient will be discharged home with prescriptions for indomethacin and Vicodin. Patient is to follow up with PCP as directed. Patient is given ED precautions to return to the ED for any worsening or new symptoms.     ____________________________________________  FINAL CLINICAL IMPRESSION(S) / ED DIAGNOSES  Final diagnoses:  Gout of right knee, unspecified cause, unspecified chronicity      NEW MEDICATIONS STARTED DURING THIS VISIT:  Discharge Medication List as of 03/01/2017  5:44 PM    START taking these  medications   Details  HYDROcodone-acetaminophen (NORCO/VICODIN) 5-325 MG tablet Take 1 tablet by mouth every 6 (six) hours as needed for moderate pain., Starting Mon 03/01/2017, Print    indomethacin (INDOCIN) 25 MG capsule Take 1 capsule (25 mg total) by mouth 2 (two) times daily with a meal., Starting Mon 03/01/2017, Print            This chart was dictated using voice recognition software/Dragon. Despite best efforts to proofread, errors can occur which can change the meaning. Any change was purely unintentional.    Enid Derry, PA-C 03/01/17 1856    Jene Every, MD 03/08/17 [phone]

## 2017-03-01 NOTE — ED Triage Notes (Signed)
States R knee pain x 4 days, denies injury.

## 2017-03-05 ENCOUNTER — Telehealth: Payer: Self-pay | Admitting: Emergency Medicine

## 2017-03-05 NOTE — Telephone Encounter (Signed)
Called patient to inform of cpr result and ask about follow up plans.  No answer and no voicemail.  Said "not available."

## 2017-07-17 ENCOUNTER — Emergency Department
Admission: EM | Admit: 2017-07-17 | Discharge: 2017-07-17 | Disposition: A | Discharge status: Home / Self Care | Payer: No Typology Code available for payment source | Attending: Emergency Medicine | Admitting: Emergency Medicine

## 2017-07-17 ENCOUNTER — Encounter: Payer: Self-pay | Admitting: Emergency Medicine

## 2017-07-17 DIAGNOSIS — Z79899 Other long term (current) drug therapy: Secondary | ICD-10-CM | POA: Insufficient documentation

## 2017-07-17 DIAGNOSIS — M109 Gout, unspecified: Secondary | ICD-10-CM | POA: Insufficient documentation

## 2017-07-17 MED ORDER — METHYLPREDNISOLONE SODIUM SUCC 125 MG IJ SOLR
125.0000 mg | Freq: Once | INTRAMUSCULAR | Status: AC
  Administered 2017-07-17: 125 mg via INTRAMUSCULAR
  Filled 2017-07-17: qty 2

## 2017-07-17 MED ORDER — INDOMETHACIN 25 MG PO CAPS: 25.0000 mg | ORAL_CAPSULE | Freq: Two times a day (BID) | ORAL | 0 refills | Status: AC

## 2017-07-17 MED ORDER — PREDNISONE 50 MG PO TABS: ORAL_TABLET | ORAL | 0 refills | Status: DC

## 2017-07-17 MED ORDER — KETOROLAC TROMETHAMINE 60 MG/2ML IM SOLN
30.0000 mg | Freq: Once | INTRAMUSCULAR | Status: AC
  Administered 2017-07-17: 30 mg via INTRAMUSCULAR
  Filled 2017-07-17: qty 2

## 2017-07-17 NOTE — ED Provider Notes (Signed)
[facility] Emergency Department Provider Note  ____________________________________________  Time seen: Approximately 12:54 PM  I have reviewed the triage vital signs and the nursing notes.   HISTORY  Chief Complaint Foot Pain    HPI Tony Carlson is a 46 y.o. male that presents to the emergency department for evaluation ofleft ankle pain. Patient states that he has a history of gout and this feels exactly the same. His gout flares about every 6 months. Indomethacin improved symptoms last time. No IV drug use. He denies fever, shortness of breath, chest pain, nausea, vomiting, abdominal pain, numbness, tingling.   History reviewed. No pertinent past medical history.  There are no active problems to display for this patient.   History reviewed. No pertinent surgical history.  Prior to Admission medications   Medication Sig Start Date End Date Taking? Authorizing Provider  allopurinol (ZYLOPRIM) 100 MG tablet Take 1 tablet (100 mg total) by mouth daily. May increase to 2 tablets daily after 1 week if symptoms have not resolved. 10/28/16 10/28/17  Cuthriell, Delorise RoyalsJonathan D, PA-C  colchicine 0.6 MG tablet Take 1 tablet (0.6 mg total) by mouth daily. Take 2 tablets first day, 1 hour later take 1 more tab for a total of 3 tabs on the 1st day Take 1 tab daily for at least 6 more days. If  Symptoms persist past 6 days continue to use until prescription is finished 01/15/16   Cuthriell, Delorise RoyalsJonathan D, PA-C  HYDROcodone-acetaminophen (NORCO/VICODIN) 5-325 MG tablet Take 1 tablet by mouth every 6 (six) hours as needed for moderate pain. 03/01/17   Enid DerryWagner, Coryn Mosso, PA-C  indomethacin (INDOCIN) 25 MG capsule Take 1 capsule (25 mg total) by mouth 2 (two) times daily with a meal. 07/17/17 07/27/17  Enid DerryWagner, Ossie Yebra, PA-C  naproxen (EC NAPROSYN) 500 MG EC tablet Take 1 tablet (500 mg total) by mouth 2 (two) times daily with a meal. 05/26/16   Menshew, Charlesetta IvoryJenise V Bacon, PA-C  predniSONE  (DELTASONE) 50 MG tablet Take 1 tablet per day 07/17/17   Enid DerryWagner, Khiara Shuping, PA-C    Allergies Patient has no known allergies.  History reviewed. No pertinent family history.  Social History Social History  Substance Use Topics  . Smoking status: Never Smoker  . Smokeless tobacco: Never Used  . Alcohol use Yes     Review of Systems  Constitutional: No fever/chills Cardiovascular: No chest pain. Respiratory:  No SOB. Gastrointestinal: No abdominal pain.  No nausea, no vomiting.  Skin: Negative for abrasions, lacerations, ecchymosis. Neurological: Negative for headaches, numbness or tingling   ____________________________________________   PHYSICAL EXAM:  VITAL SIGNS: ED Triage Vitals [07/17/17 1121]  Enc Vitals Group     BP (!) 167/101     Pulse Rate 94     Resp 18     Temp 97.7 F (36.5 C)     Temp Source Oral     SpO2 100 %     Weight 240 lb (108.9 kg)     Height      Head Circumference      Peak Flow      Pain Score 10     Pain Loc      Pain Edu?      Excl. in GC?      Constitutional: Alert and oriented. Well appearing and in no acute distress. Eyes: Conjunctivae are normal. PERRL. EOMI. Head: Atraumatic. ENT:      Ears:      Nose: No congestion/rhinnorhea.  Mouth/Throat: Mucous membranes are moist.  Neck: No stridor. Cardiovascular: Normal rate, regular rhythm.  Good peripheral circulation. Respiratory: Normal respiratory effort without tachypnea or retractions. Lungs CTAB. Good air entry to the bases with no decreased or absent breath sounds. Musculoskeletal: Full range of motion to all extremities. No gross deformities appreciated. Tenderness to palpation over left medial malleolus with mild swelling. Neurologic:  Normal speech and language. No gross focal neurologic deficits are appreciated.  Skin:  Skin is warm, dry and intact.   ____________________________________________   LABS (all labs ordered are listed, but only abnormal results are  displayed)  Labs Reviewed - No data to display ____________________________________________  EKG   ____________________________________________  RADIOLOGY  No results found.  ____________________________________________    PROCEDURES  Procedure(s) performed:    Procedures    Medications  methylPREDNISolone sodium succinate (SOLU-MEDROL) 125 mg/2 mL injection 125 mg (125 mg Intramuscular Given 07/17/17 1204)  ketorolac (TORADOL) injection 30 mg (30 mg Intramuscular Given 07/17/17 1205)     ____________________________________________   INITIAL IMPRESSION / ASSESSMENT AND PLAN / ED COURSE  Pertinent labs & imaging results that were available during my care of the patient were reviewed by me and considered in my medical decision making (see chart for details).  Review of the  CSRS was performed in accordance of the [facility] prior to dispensing any controlled drugs.  Patient's diagnosis is consistent with gout. Vital signs and exam are reassuring. Patient was given IM Solu-Medrol and Toradol in ED. Patient will be discharged home with prescriptions for indomethacin and prednisone. Patient is to follow up with PCP as directed. Patient is given ED precautions to return to the ED for any worsening or new symptoms.     ____________________________________________  FINAL CLINICAL IMPRESSION(S) / ED DIAGNOSES  Final diagnoses:  Acute gout of left ankle, unspecified cause      NEW MEDICATIONS STARTED DURING THIS VISIT:  Discharge Medication List as of 07/17/2017 12:55 PM    START taking these medications   Details  predniSONE (DELTASONE) 50 MG tablet Take 1 tablet per day, Print            This chart was dictated using voice recognition software/Dragon. Despite best efforts to proofread, errors can occur which can change the meaning. Any change was purely unintentional.    Enid Derry, PA-C 07/17/17 1635    Dionne Bucy, MD 07/18/17 [phone]

## 2017-07-17 NOTE — ED Triage Notes (Signed)
Pt to ed with c/o left foot pain, states it is red and swollen and painful to walk on, hx of gout.

## 2017-11-29 ENCOUNTER — Encounter: Payer: Self-pay | Admitting: Emergency Medicine

## 2017-11-29 ENCOUNTER — Other Ambulatory Visit: Payer: Self-pay

## 2017-11-29 DIAGNOSIS — I10 Essential (primary) hypertension: Secondary | ICD-10-CM | POA: Insufficient documentation

## 2017-11-29 DIAGNOSIS — M109 Gout, unspecified: Secondary | ICD-10-CM | POA: Insufficient documentation

## 2017-11-29 NOTE — ED Triage Notes (Signed)
Pt arrived to the ED for complaints of right arm/elbow pain. Pt reports that it hurst like Gout. Pt denies nay injury. Pt is AOx4 in no apparent distress.

## 2017-11-30 ENCOUNTER — Encounter: Payer: Self-pay | Admitting: Emergency Medicine

## 2017-11-30 ENCOUNTER — Emergency Department
Admission: EM | Admit: 2017-11-30 | Discharge: 2017-11-30 | Disposition: A | Discharge status: Home / Self Care | Payer: No Typology Code available for payment source | Attending: Emergency Medicine | Admitting: Emergency Medicine

## 2017-11-30 DIAGNOSIS — M109 Gout, unspecified: Secondary | ICD-10-CM

## 2017-11-30 HISTORY — DX: Gout, unspecified: M10.9

## 2017-11-30 HISTORY — DX: Essential (primary) hypertension: I10

## 2017-11-30 MED ORDER — KETOROLAC TROMETHAMINE 30 MG/ML IJ SOLN
15.0000 mg | Freq: Once | INTRAMUSCULAR | Status: AC
  Administered 2017-11-30: 15 mg via INTRAMUSCULAR
  Filled 2017-11-30: qty 1

## 2017-11-30 MED ORDER — PREDNISONE 10 MG PO TABS: ORAL_TABLET | ORAL | 0 refills | Status: DC

## 2017-11-30 MED ORDER — PREDNISONE 20 MG PO TABS
60.0000 mg | ORAL_TABLET | ORAL | Status: AC
  Administered 2017-11-30: 60 mg via ORAL
  Filled 2017-11-30: qty 3

## 2017-11-30 NOTE — ED Notes (Signed)

## 2017-11-30 NOTE — Discharge Instructions (Signed)
We believe, based on your history of physical exam, that you are suffering from a gout flare in your right elbow.  We gave you a shot of an anti-inflammatory medication called Toradol to help with the pain, but in general moving forward, we do not recommend that you take NSAIDs (ibuprofen, Toradol, naproxen, aspirin, etc) while taking the prescribed prednisone, because it can lead to stomach ulcers.  The prednisone should do a good job of resolving your gout flare.  Please take the full course as prescribed.  You can take acetaminophen (Tylenol) along with the prednisone without any concerns. We recommend you take no more than 1000 mg every 6 hours.    Please call your regular doctor and schedule the next available follow-up appointment.  Return to the emergency department if you develop new or worsening symptoms that concern you, including but not limited to fever, increased arm pain or swelling, nausea/vomiting, etc.

## 2017-11-30 NOTE — ED Notes (Signed)
ED Provider at bedside. 

## 2017-11-30 NOTE — ED Provider Notes (Signed)
[facility] Emergency Department Provider Note  ____________________________________________   First MD Initiated Contact with Patient 11/30/17 0125     (approximate)  I have reviewed the triage vital signs and the nursing notes.   HISTORY  Chief Complaint Arm Pain    HPI Tony Carlson is a 47 y.o. male with a history of multiple episodes of gout in the past who presents for evaluation of gradual onset pain and swelling in his right elbow.  He states it feels just like his prior gout flares but he has not had gout in his elbow in the past.  He says that it started to feel tingly yesterday and then gradually and slowly became painful and then started to swell.  The pain is severe tonight and keeping him from being able to sleep so he came in.  He states that the last time he was here was about 4 months ago and it was for the same symptoms in his ankle.  He felt much better after an injection of Toradol and he was started on prednisone and indomethacin.  It resolved quickly on these medications.  He has a doctor in [place] with whom he wants to follow-up but he said the pain was bad enough tonight he could not wait.  Recent fever/chills, chest pain, shortness of breath, nausea, vomiting, abdominal pain, and dysuria.  He has not injured his arm recently with no traumas and is not been lifting weights.  He states that he gets gout regardless of what he eats.  He is no longer drinking alcohol.  He has had no recent dietary changes although he has lost quite a bit of weight intentionally recently in an attempt to get in better health and control his blood pressure with diet and exercise.  He has had no breaks in the skin, surgeries, or any other reason to be concerned for an infectious process in the elbow.   Past Medical History:  Diagnosis Date  . Gout    has presented separately but in multiple different joints in the past  . Hypertension     There are no  active problems to display for this patient.   History reviewed. No pertinent surgical history.  Prior to Admission medications   Medication Sig Start Date End Date Taking? Authorizing Provider  allopurinol (ZYLOPRIM) 100 MG tablet Take 1 tablet (100 mg total) by mouth daily. May increase to 2 tablets daily after 1 week if symptoms have not resolved. 10/28/16 10/28/17  Cuthriell, Delorise RoyalsJonathan D, PA-C  colchicine 0.6 MG tablet Take 1 tablet (0.6 mg total) by mouth daily. Take 2 tablets first day, 1 hour later take 1 more tab for a total of 3 tabs on the 1st day Take 1 tab daily for at least 6 more days. If  Symptoms persist past 6 days continue to use until prescription is finished 01/15/16   Cuthriell, Delorise RoyalsJonathan D, PA-C  HYDROcodone-acetaminophen (NORCO/VICODIN) 5-325 MG tablet Take 1 tablet by mouth every 6 (six) hours as needed for moderate pain. 03/01/17   Enid DerryWagner, Ashley, PA-C  predniSONE (DELTASONE) 10 MG tablet Take 6 tabs (60 mg) PO x 3 days, then take 4 tabs (40 mg) PO x 3 days, then take 2 tabs (20 mg) PO x 3 days, then take 1 tab (10 mg) PO x 3 days, then take 1/2 tab (5 mg) PO x 4 days. 11/30/17   Loleta RoseForbach, Zaidin Blyden, MD    Allergies Patient has no known allergies.  History reviewed. No pertinent family history.  Social History Social History   Tobacco Use  . Smoking status: Never Smoker  . Smokeless tobacco: Never Used  Substance Use Topics  . Alcohol use: Yes  . Drug use: Not on file    Review of Systems Constitutional: No fever/chills Eyes: No visual changes. ENT: No sore throat. Cardiovascular: Denies chest pain. Respiratory: Denies shortness of breath. Gastrointestinal: No abdominal pain.  No nausea, no vomiting.  No diarrhea.  No constipation. Genitourinary: Negative for dysuria. Musculoskeletal: Pain and swelling in the right elbow. Integumentary: Negative for rash. Neurological: Negative for headaches, focal weakness or  numbness.   ____________________________________________   PHYSICAL EXAM:  VITAL SIGNS: ED Triage Vitals  Enc Vitals Group     BP 11/29/17 2219 (!) 165/97     Pulse Rate 11/29/17 2219 81     Resp 11/29/17 2219 18     Temp 11/29/17 2219 98.1 F (36.7 C)     Temp Source 11/29/17 2219 Oral     SpO2 11/29/17 2219 97 %     Weight 11/29/17 2220 104.8 kg (231 lb)     Height 11/29/17 2220 1.778 m (5\' 10" )     Head Circumference --      Peak Flow --      Pain Score 11/29/17 2219 8     Pain Loc --      Pain Edu? --      Excl. in GC? --     Constitutional: Alert and oriented. Well appearing and in no acute distress. Eyes: Conjunctivae are normal.  Head: Atraumatic. Nose: No congestion/rhinnorhea. Mouth/Throat: Mucous membranes are moist. Neck: No stridor.  No meningeal signs.   Cardiovascular: Normal rate, regular rhythm. Good peripheral circulation. Respiratory: Normal respiratory effort.  No retractions.  Musculoskeletal: Significant swelling and warmth and tenderness to palpation of and just proximal to the right elbow.  No breaks in the skin. Neurologic:  Normal speech and language. No gross focal neurologic deficits are appreciated.  Skin:  Skin is warm, dry and intact. No rash noted. Psychiatric: Mood and affect are normal. Speech and behavior are normal.  ____________________________________________   LABS (all labs ordered are listed, but only abnormal results are displayed)  Labs Reviewed - No data to display ____________________________________________  EKG  None - EKG not ordered by ED physician ____________________________________________  RADIOLOGY   No results found.  ____________________________________________   PROCEDURES  Critical Care performed: No   Procedure(s) performed:   Procedures   ____________________________________________   INITIAL IMPRESSION / ASSESSMENT AND PLAN / ED COURSE  As part of my medical decision making, I  reviewed the following data within the electronic MEDICAL RECORD NUMBER Nursing notes reviewed and incorporated, Old chart reviewed and Notes from prior ED visits    Differential diagnosis includes, but is not limited to, gout flare, bursitis, septic arthritis, fracture/dislocation, cellulitis, abscess.  However, this patient has a well known and well-documented history of gout and he is very clear that this feels just like his other episodes, simply in the elbow at this time instead of the other joints.  Given the potential complications and pain of arthrocentesis and given no other indications at all that this is a septic joint which would be the primary acute/emergent medical condition that we need to rule out, I think it is appropriate to treat empirically for gout.  He is also comfortable with this plan.  I will treat with a dose of Toradol 15 mg intramuscular and prednisone  60 mg.  However I will not prescribe both indomethacin and prednisone given the concern that he could develop a stomach ulcer.  I will treat with a prednisone taper (as per UpToDate recommendations) and advised him to follow-up as soon as possible with his regular doctor.   I gave my usual and customary return precautions.  He understands and agrees with the plan.    ____________________________________________  FINAL CLINICAL IMPRESSION(S) / ED DIAGNOSES  Final diagnoses:  Acute gout of right elbow, unspecified cause     MEDICATIONS GIVEN DURING THIS VISIT:  Medications  ketorolac (TORADOL) 30 MG/ML injection 15 mg (15 mg Intramuscular Given 11/30/17 0147)  predniSONE (DELTASONE) tablet 60 mg (60 mg Oral Given 11/30/17 0147)     ED Discharge Orders        Ordered    predniSONE (DELTASONE) 10 MG tablet     11/30/17 0146       Note:  This document was prepared using Dragon voice recognition software and may include unintentional dictation errors.    Loleta Rose, MD 11/30/17 [phone]

## 2018-06-11 IMAGING — DX DG KNEE COMPLETE 4+V*L*
4 series · 4 of 4 positions shown · non-contrast
Comparison: None.

CLINICAL DATA: Left knee pain after fall several days ago.

EXAM:
LEFT KNEE - COMPLETE 4+ VIEW

[knee ap]
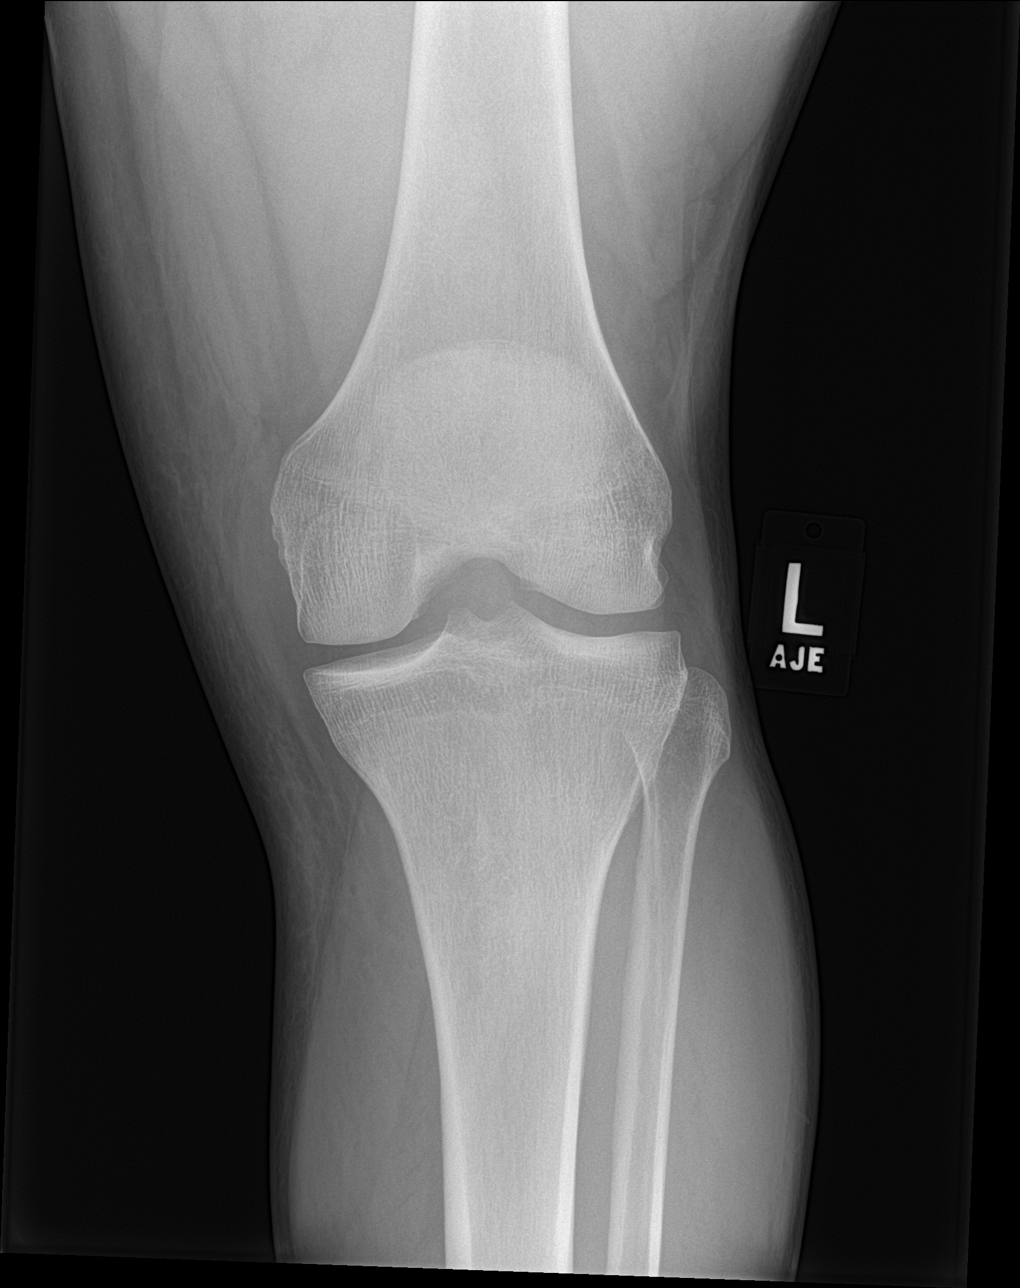

[knee lat]
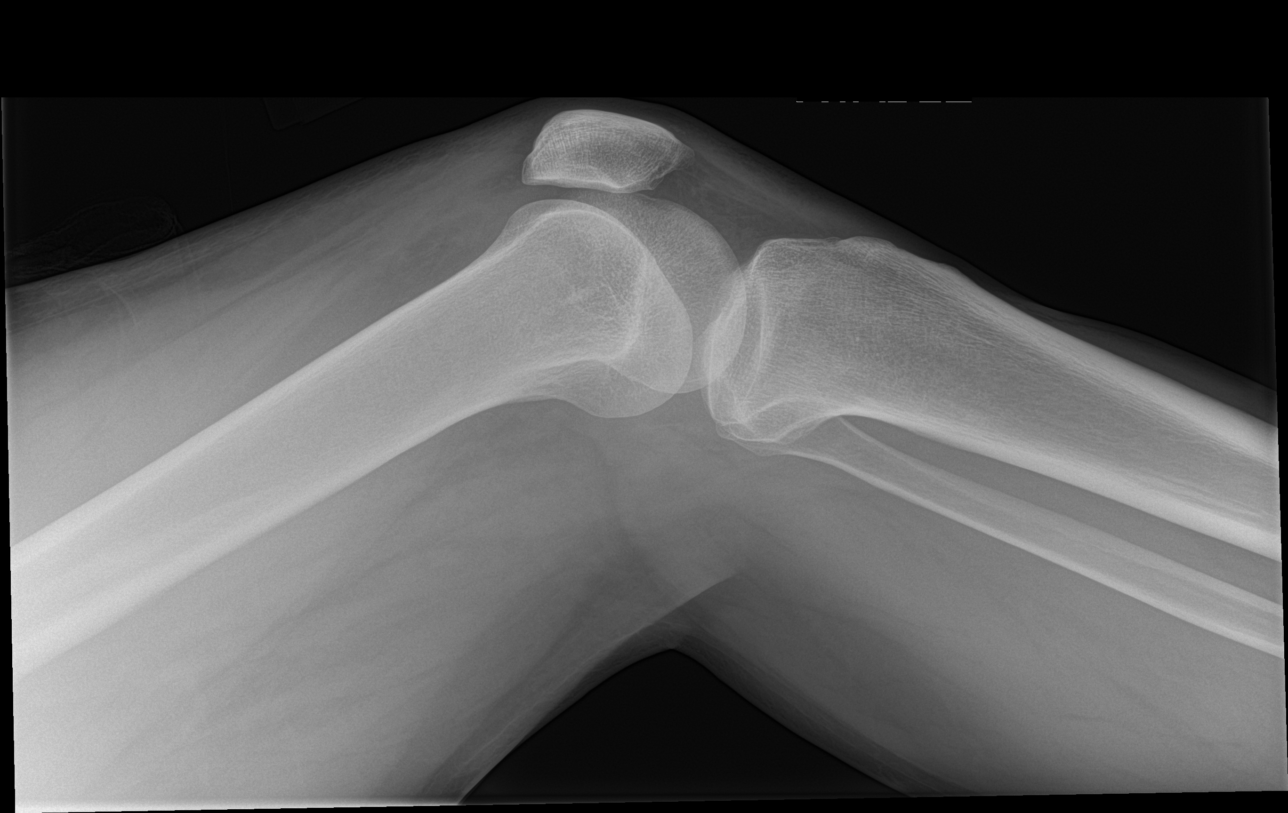

[knee obl (1 of 2)]
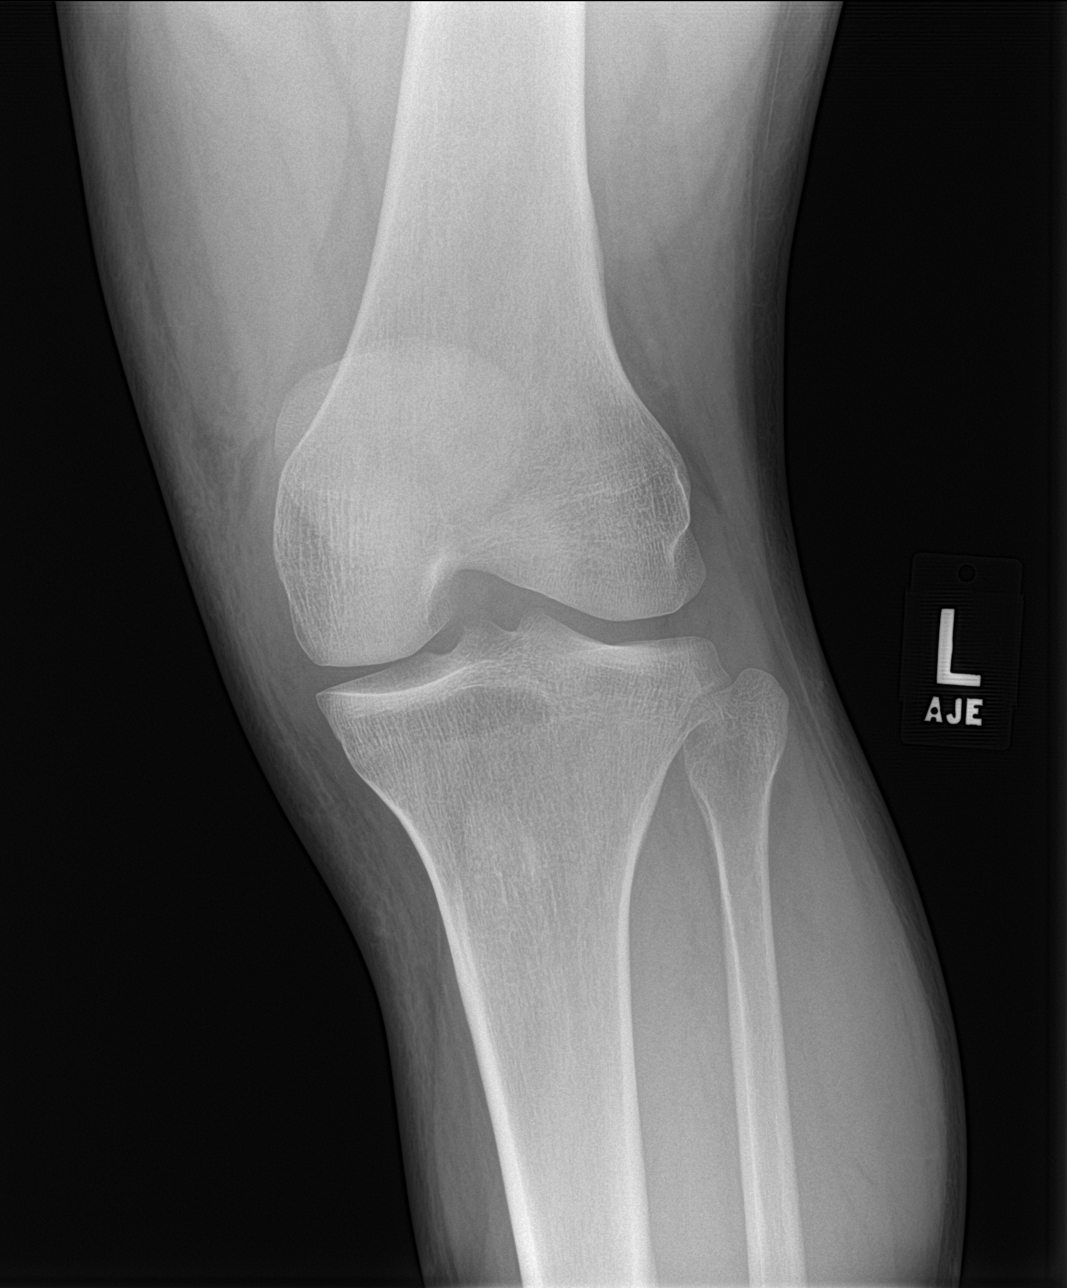

[knee obl (2 of 2)]
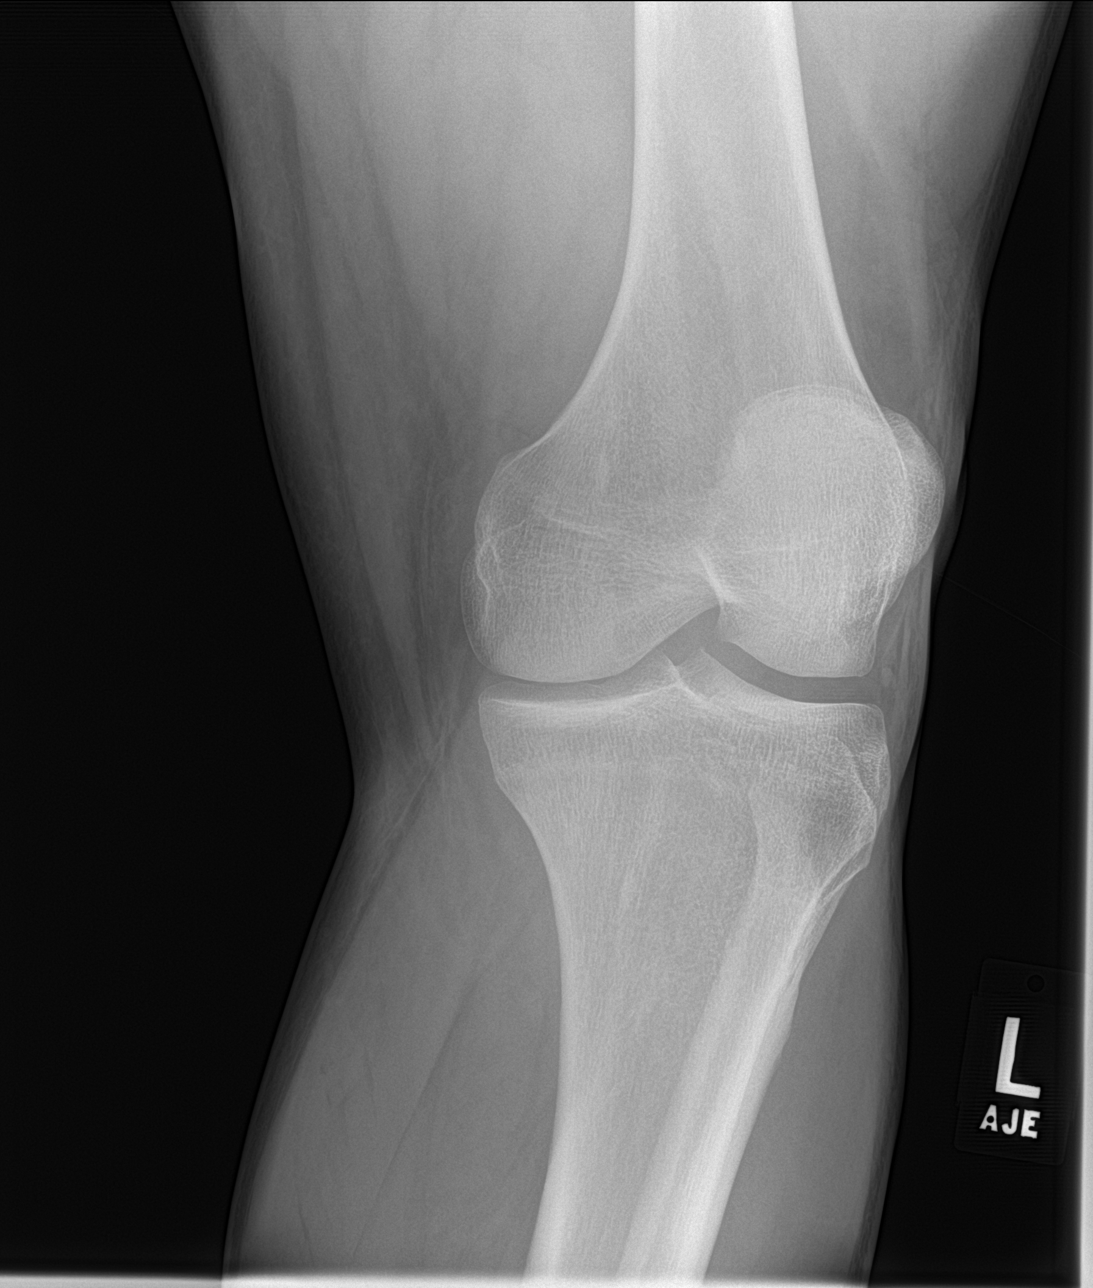

[4 of 4 positions shown; findings below may reference images not displayed]

FINDINGS: No evidence of fracture, dislocation, or joint effusion. No evidence
of arthropathy or other focal bone abnormality. Soft tissues are
unremarkable.
IMPRESSION: Normal left knee.

## 2019-09-05 ENCOUNTER — Emergency Department
Admission: EM | Admit: 2019-09-05 | Discharge: 2019-09-05 | Disposition: A | Discharge status: Home / Self Care | Payer: 59 | Attending: Emergency Medicine | Admitting: Emergency Medicine

## 2019-09-05 ENCOUNTER — Other Ambulatory Visit: Payer: Self-pay

## 2019-09-05 ENCOUNTER — Encounter: Payer: Self-pay | Admitting: *Deleted

## 2019-09-05 DIAGNOSIS — M109 Gout, unspecified: Secondary | ICD-10-CM | POA: Insufficient documentation

## 2019-09-05 DIAGNOSIS — I10 Essential (primary) hypertension: Secondary | ICD-10-CM | POA: Diagnosis not present

## 2019-09-05 DIAGNOSIS — M79641 Pain in right hand: Secondary | ICD-10-CM | POA: Diagnosis present

## 2019-09-05 MED ORDER — HYDROCODONE-ACETAMINOPHEN 5-325 MG PO TABS: 1.0000 | ORAL_TABLET | Freq: Four times a day (QID) | ORAL | 0 refills | Status: DC | PRN

## 2019-09-05 MED ORDER — COLCHICINE 0.6 MG PO TABS: 1.2000 mg | ORAL_TABLET | Freq: Once | ORAL | 0 refills | Status: DC

## 2019-09-05 MED ORDER — PREDNISONE 10 MG (21) PO TBPK: ORAL_TABLET | ORAL | 0 refills | Status: DC

## 2019-09-05 MED ORDER — COLCHICINE 0.6 MG PO TABS
1.2000 mg | ORAL_TABLET | Freq: Once | ORAL | Status: AC
  Administered 2019-09-05: 1.2 mg via ORAL
  Filled 2019-09-05: qty 2

## 2019-09-05 MED ORDER — PREDNISONE 20 MG PO TABS
60.0000 mg | ORAL_TABLET | Freq: Once | ORAL | Status: AC
  Administered 2019-09-05: 60 mg via ORAL
  Filled 2019-09-05: qty 3

## 2019-09-05 NOTE — ED Triage Notes (Signed)
Pt has swollen right hand for 3 days.  Hx gout.  No meds.  No known injury.  Pt alert

## 2019-09-05 NOTE — Discharge Instructions (Addendum)
Please call and schedule an appointment with primary care.  Return to the ER for symptoms that change or worsen if unable to be seen right away.

## 2019-09-05 NOTE — ED Notes (Signed)
Pt here with hand pain. Pt states that he knows that it is related to his gout since he has been out of his medicine for a while and has not found a new primary provider.

## 2019-09-05 NOTE — ED Provider Notes (Signed)
[facility] Provider Note ____________________________________________  Time seen: Approximately 10:40 PM  I have reviewed the triage vital signs and the nursing notes.   HISTORY  Chief Complaint Hand Pain    HPI Tony [facility] is a 48 y.o. male who presents to the emergency [facility] for evaluation and treatment of right hand pain. He states that it started 3 days ago. Very similar to previous gout. He states that he tried Allopurinol which has not helped.   Past Medical History:  Diagnosis Date  . Gout    has presented separately but in multiple different joints in the past  . Hypertension     There are no active problems to display for this patient.   No past surgical history on file.  Prior to Admission medications   Medication Sig Start Date End Date Taking? Authorizing Provider  allopurinol (ZYLOPRIM) 100 MG tablet Take 1 tablet (100 mg total) by mouth daily. May increase to 2 tablets daily after 1 week if symptoms have not resolved. 10/28/16 10/28/17  Cuthriell, Delorise Royals, PA-C  colchicine 0.6 MG tablet Take 2 tablets (1.2 mg total) by mouth once for 1 dose. If pain persists after 1 hour, take 1 additional pill. No more than 3 pills in 24 hours. 09/05/19 09/05/19  Tai Syfert, Rulon Eisenmenger B, FNP  HYDROcodone-acetaminophen (NORCO/VICODIN) 5-325 MG tablet Take 1 tablet by mouth every 6 (six) hours as needed for moderate pain. 09/05/19   Vern Prestia B, FNP  predniSONE (STERAPRED UNI-PAK 21 TAB) 10 MG (21) TBPK tablet Take 6 tablets on the first day and decrease by 1 tablet each day until finished. 09/05/19   Chinita Pester, FNP    Allergies Patient has no known allergies.  No family history on file.  Social History Social History   Tobacco Use  . Smoking status: Never Smoker  . Smokeless tobacco: Never Used  Substance Use Topics  . Alcohol use: Not Currently  . Drug use: Not on file    Review of  Systems Constitutional: Negative for fever. Cardiovascular: Negative for chest pain. Respiratory: Negative for shortness of breath. Musculoskeletal: Positive for right hand pain. Skin: Positive for swelling and redness in index and middle MCP of right hand.  Neurological: Negative for decrease in sensation  ____________________________________________   PHYSICAL EXAM:  VITAL SIGNS: ED Triage Vitals  Enc Vitals Group     BP 09/05/19 2149 (!) 164/95     Pulse Rate 09/05/19 2149 75     Resp 09/05/19 2149 18     Temp 09/05/19 2149 98.5 F (36.9 C)     Temp Source 09/05/19 2149 Oral     SpO2 09/05/19 2149 96 %     Weight 09/05/19 2147 250 lb (113.4 kg)     Height 09/05/19 2147 5\' 10"  (1.778 m)     Head Circumference --      Peak Flow --      Pain Score 09/05/19 2147 7     Pain Loc --      Pain Edu? --      Excl. in GC? --     Constitutional: Alert and oriented. Well appearing and in no acute distress. Eyes: Conjunctivae are clear without discharge or drainage Head: Atraumatic Neck: Supple Respiratory: No cough. Respirations are even and unlabored. Musculoskeletal: Swelling and erythema over the MCPs of the index and middle fingers of the right hand.  Neurologic: Motor and sensory function is intact.   Skin: erythema and edema over  the MCP of the index and middle fingers  Psychiatric: Affect and behavior are appropriate.  ____________________________________________   LABS (all labs ordered are listed, but only abnormal results are displayed)  Labs Reviewed - No data to display ____________________________________________  RADIOLOGY  Not indicated. ____________________________________________   PROCEDURES  Procedures  ____________________________________________   INITIAL IMPRESSION / ASSESSMENT AND PLAN / ED COURSE  Tony [facility]son is a 48 y.o. who presents to the emergency [facility] for treatment and evaluation of nontraumatic right hand pain.   Patient states that symptoms started a few days ago.  He does have a history of gout and states that this feels very similar.  He thought that allopurinol was to be taken when he had a gout flare.  I have advised him that he needs to not take allopurinol until this gout flare has resolved and then start taking his allopurinol daily.  Tonight, he was given colchicine and prednisone which is the combination that he feels were has worked the best in the past along with hydrocodone.  He is a Games developer.  He was given a prescription for 12 tablets of hydrocodone and advised not to take them before or during work.  He was discharged home with instructions to establish a primary care provider soon as possible.  He is to return to the emergency [facility] for symptoms of change or worsen if he is unable to schedule an appointment right away.   Medications  predniSONE (DELTASONE) tablet 60 mg (60 mg Oral Given 09/05/19 2244)  colchicine tablet 1.2 mg (1.2 mg Oral Given 09/05/19 2243)    Pertinent labs & imaging results that were available during my care of the patient were reviewed by me and considered in my medical decision making (see chart for details).  _________________________________________   FINAL CLINICAL IMPRESSION(S) / ED DIAGNOSES  Final diagnoses:  Acute gout of right hand, unspecified cause    ED Discharge Orders         Ordered    HYDROcodone-acetaminophen (NORCO/VICODIN) 5-325 MG tablet  Every 6 hours PRN     09/05/19 2240    colchicine 0.6 MG tablet   Once     09/05/19 2240    predniSONE (STERAPRED UNI-PAK 21 TAB) 10 MG (21) TBPK tablet     09/05/19 2240           If controlled substance prescribed during this visit, 12 month history viewed on the [facility] prior to issuing an initial prescription for Schedule II or III opiod.   Tony Dike, FNP 09/05/19 2309    Tony [place], MD 09/06/19 [phone]

## 2020-04-12 ENCOUNTER — Emergency Department
Admission: EM | Admit: 2020-04-12 | Discharge: 2020-04-12 | Disposition: A | Discharge status: Home / Self Care | Payer: 59 | Attending: Emergency Medicine | Admitting: Emergency Medicine

## 2020-04-12 ENCOUNTER — Encounter: Payer: Self-pay | Admitting: *Deleted

## 2020-04-12 ENCOUNTER — Other Ambulatory Visit: Payer: Self-pay

## 2020-04-12 DIAGNOSIS — H6122 Impacted cerumen, left ear: Secondary | ICD-10-CM

## 2020-04-12 DIAGNOSIS — I10 Essential (primary) hypertension: Secondary | ICD-10-CM | POA: Insufficient documentation

## 2020-04-12 NOTE — ED Triage Notes (Signed)
Pt ambulatory to triage.  Pt has left earache  Sx for 2 weeks.  Pt alert.  Speech clear.

## 2020-04-12 NOTE — Discharge Instructions (Signed)
Use OTC Debrox or Murine drops to keep ears clear of excessive wax. Follow-up with your provider or return as needed.

## 2020-04-12 NOTE — ED Provider Notes (Signed)
[facility] Emergency Department Provider Note ____________________________________________  Time seen: 1644  I have reviewed the triage vital signs and the nursing notes.  HISTORY  Chief Complaint  Otalgia  HPI Tony Carlson is a 49 y.o. male Modena Jansky himself to the ED for several days of left ear pain and decreased hearing.  Patient denies any fever, chills, sweats.  He also denies any vertigo, dizziness, or syncope.   Past Medical History:  Diagnosis Date  . Gout    has presented separately but in multiple different joints in the past  . Hypertension     There are no problems to display for this patient.   History reviewed. No pertinent surgical history.  Prior to Admission medications   Medication Sig Start Date End Date Taking? Authorizing Provider  allopurinol (ZYLOPRIM) 100 MG tablet Take 1 tablet (100 mg total) by mouth daily. May increase to 2 tablets daily after 1 week if symptoms have not resolved. 10/28/16 10/28/17  Cuthriell, Delorise Royals, PA-C  colchicine 0.6 MG tablet Take 2 tablets (1.2 mg total) by mouth once for 1 dose. If pain persists after 1 hour, take 1 additional pill. No more than 3 pills in 24 hours. 09/05/19 09/05/19  Triplett, Rulon Eisenmenger B, FNP  HYDROcodone-acetaminophen (NORCO/VICODIN) 5-325 MG tablet Take 1 tablet by mouth every 6 (six) hours as needed for moderate pain. 09/05/19   Triplett, Cari B, FNP  predniSONE (STERAPRED UNI-PAK 21 TAB) 10 MG (21) TBPK tablet Take 6 tablets on the first day and decrease by 1 tablet each day until finished. 09/05/19   Chinita Pester, FNP    Allergies Patient has no known allergies.  History reviewed. No pertinent family history.  Social History Social History   Tobacco Use  . Smoking status: Never Smoker  . Smokeless tobacco: Never Used  Substance Use Topics  . Alcohol use: Not Currently  . Drug use: Not on file    Review of Systems  Constitutional: Negative for fever. Eyes:  Negative for visual changes. ENT: Negative for sore throat.  Left otalgia as above. Respiratory: Negative for shortness of breath. Gastrointestinal: Negative for abdominal pain, vomiting and diarrhea. Musculoskeletal: Negative for back pain. Skin: Negative for rash. Neurological: Negative for headaches, focal weakness or numbness. ____________________________________________  PHYSICAL EXAM:  VITAL SIGNS: ED Triage Vitals  Enc Vitals Group     BP 04/12/20 1615 139/76     Pulse Rate 04/12/20 1615 61     Resp 04/12/20 1615 18     Temp 04/12/20 1615 98.4 F (36.9 C)     Temp Source 04/12/20 1615 Oral     SpO2 04/12/20 1615 99 %     Weight 04/12/20 1617 200 lb (90.7 kg)     Height 04/12/20 1617 5\' 10"  (1.778 m)     Head Circumference --      Peak Flow --      Pain Score 04/12/20 1617 5     Pain Loc --      Pain Edu? --      Excl. in GC? --     Constitutional: Alert and oriented. Well appearing and in no distress. Head: Normocephalic and atraumatic. Eyes: Conjunctivae are normal. PERRL. Normal extraocular movements Ears: Canal on the left obscured by soft wax.  Gross hearing intact. Cardiovascular: Normal rate, regular rhythm. Normal distal pulses. Respiratory: Normal respiratory effort.  Musculoskeletal: Nontender with normal range of motion in all extremities.  Neurologic:  Normal gait without ataxia. Normal speech and  language. No gross focal neurologic deficits are appreciated. Skin:  Skin is warm, dry and intact. No rash noted. Psychiatric: Mood and affect are normal. Patient exhibits appropriate insight and judgment. ____________________________________________  PROCEDURES  .Ear Cerumen Removal  Date/Time: 04/12/2020 5:32 PM Performed by: Melvenia Needles, PA-C Authorized by: Melvenia Needles, PA-C   Consent:    Consent obtained:  Verbal   Risks discussed:  Pain and incomplete removal   Alternatives discussed:  Alternative treatment Procedure  details:    Location:  L ear   Procedure type: irrigation   Post-procedure details:    Inspection:  TM intact   Hearing quality:  Improved   Patient tolerance of procedure:  Tolerated well, no immediate complications  ____________________________________________  INITIAL IMPRESSION / ASSESSMENT AND PLAN / ED COURSE  Patient with ED evaluation of left ear otalgia and decreased hearing.  Patient was found to have a soft cerumen impaction on the left.  The cerumen was cleared using irrigation with a one-to-one ratio of hydrogen peroxide and warm water.  Patient is discharged after successful ear wash to follow-up with his primary provider or local urgent care.  He is also advised use over-the-counter earwax drops as directed peer return precautions have been reviewed.  Tony [facility]son was evaluated in Emergency Department on 04/12/2020 for the symptoms described in the history of present illness. He was evaluated in the context of the global COVID-19 pandemic, which necessitated consideration that the patient might be at risk for infection with the SARS-CoV-2 virus that causes COVID-19. Institutional protocols and algorithms that pertain to the evaluation of patients at risk for COVID-19 are in a state of rapid change based on information released by regulatory bodies including the CDC and federal and state organizations. These policies and algorithms were followed during the patient's care in the ED. ____________________________________________  FINAL CLINICAL IMPRESSION(S) / ED DIAGNOSES  Final diagnoses:  Impacted cerumen of left ear      Melvenia Needles, PA-C 04/12/20 1833    Lavonia Drafts, MD 04/12/20 [phone]

## 2020-04-12 NOTE — ED Notes (Signed)
Pt from home with ear pain, mostly c/o of the feeling of fluid in his ear. Pt states this has been going on for 2 weeks. Pt alert & oriented, nad noted.

## 2021-03-18 ENCOUNTER — Emergency Department: Payer: PRIVATE HEALTH INSURANCE

## 2021-03-18 ENCOUNTER — Emergency Department
Admission: EM | Admit: 2021-03-18 | Discharge: 2021-03-18 | Disposition: A | Discharge status: Home / Self Care | Payer: PRIVATE HEALTH INSURANCE | Attending: Emergency Medicine | Admitting: Emergency Medicine

## 2021-03-18 ENCOUNTER — Other Ambulatory Visit: Payer: Self-pay

## 2021-03-18 DIAGNOSIS — R0989 Other specified symptoms and signs involving the circulatory and respiratory systems: Secondary | ICD-10-CM | POA: Diagnosis not present

## 2021-03-18 DIAGNOSIS — Z20822 Contact with and (suspected) exposure to covid-19: Secondary | ICD-10-CM | POA: Diagnosis not present

## 2021-03-18 DIAGNOSIS — I10 Essential (primary) hypertension: Secondary | ICD-10-CM | POA: Insufficient documentation

## 2021-03-18 DIAGNOSIS — R197 Diarrhea, unspecified: Secondary | ICD-10-CM | POA: Insufficient documentation

## 2021-03-18 DIAGNOSIS — R059 Cough, unspecified: Secondary | ICD-10-CM

## 2021-03-18 DIAGNOSIS — R112 Nausea with vomiting, unspecified: Secondary | ICD-10-CM | POA: Diagnosis not present

## 2021-03-18 LAB — CBC
HCT: 48.6 % (ref 39.0–52.0)
Hemoglobin: 16.8 g/dL (ref 13.0–17.0)
MCH: 30.1 pg (ref 26.0–34.0)
MCHC: 34.6 g/dL (ref 30.0–36.0)
MCV: 86.9 fL (ref 80.0–100.0)
Platelets: 212 10*3/uL (ref 150–400)
RBC: 5.59 MIL/uL (ref 4.22–5.81)
RDW: 13.2 % (ref 11.5–15.5)
WBC: 7.4 10*3/uL (ref 4.0–10.5)
nRBC: 0 % (ref 0.0–0.2)

## 2021-03-18 LAB — LIPASE, BLOOD: Lipase: 66 U/L — ABNORMAL HIGH (ref 11–51)

## 2021-03-18 LAB — COMPREHENSIVE METABOLIC PANEL
ALT: 33 U/L (ref 0–44)
AST: 23 U/L (ref 15–41)
Albumin: 4.1 g/dL (ref 3.5–5.0)
Alkaline Phosphatase: 57 U/L (ref 38–126)
Anion gap: 8 (ref 5–15)
BUN: 11 mg/dL (ref 6–20)
CO2: 22 mmol/L (ref 22–32)
Calcium: 9.3 mg/dL (ref 8.9–10.3)
Chloride: 106 mmol/L (ref 98–111)
Creatinine, Ser: 1.09 mg/dL (ref 0.61–1.24)
GFR, Estimated: >60 mL/min (ref >60)
Glucose, Bld: 100 mg/dL — ABNORMAL HIGH (ref 70–99)
Potassium: 4.4 mmol/L (ref 3.5–5.1)
Sodium: 136 mmol/L (ref 135–145)
Total Bilirubin: 0.7 mg/dL (ref 0.3–1.2)
Total Protein: 7.4 g/dL (ref 6.5–8.1)

## 2021-03-18 LAB — RESP PANEL BY RT-PCR (FLU A&B, COVID) ARPGX2
Influenza A by PCR: NEGATIVE
Influenza B by PCR: NEGATIVE
SARS Coronavirus 2 by RT PCR: NEGATIVE

## 2021-03-18 MED ORDER — ONDANSETRON HCL 4 MG/2ML IJ SOLN
4.0000 mg | Freq: Once | INTRAMUSCULAR | Status: AC
  Administered 2021-03-18: 4 mg via INTRAVENOUS
  Filled 2021-03-18: qty 2

## 2021-03-18 MED ORDER — SODIUM CHLORIDE 0.9 % IV BOLUS
1000.0000 mL | Freq: Once | INTRAVENOUS | Status: AC
  Administered 2021-03-18: 1000 mL via INTRAVENOUS

## 2021-03-18 MED ORDER — ONDANSETRON 4 MG PO TBDP: 4.0000 mg | ORAL_TABLET | Freq: Three times a day (TID) | ORAL | 0 refills | Status: AC | PRN

## 2021-03-18 NOTE — ED Provider Notes (Signed)
[facility] Emergency Department Provider Note   ____________________________________________   Event Date/Time   First MD Initiated Contact with Patient 03/18/21 [phone]     (approximate)  I have reviewed the triage vital signs and the nursing notes.   HISTORY  Chief Complaint Abdominal Pain    HPI Tony [facility]son is a 50 y.o. male with the below stated past medical history who presents for 6 days of cough as well as nausea/vomiting/diarrhea that began last night just prior to arrival.  Patient states that he has been p.o. intolerant since last night.  Patient does endorse coughing up some brown sputum over the last few days but denies any subjective fever/chills.  Patient denies any exacerbating or relieving factors.  Patient denies any recent sick contacts, travel, or food out of the ordinary.  Patient currently denies any vision changes, tinnitus, difficulty speaking, facial droop, sore throat, chest pain, shortness of breath, abdominal pain, dysuria, or weakness/numbness/paresthesias in any extremity         Past Medical History:  Diagnosis Date  . Gout    has presented separately but in multiple different joints in the past  . Hypertension     There are no problems to display for this patient.   History reviewed. No pertinent surgical history.  Prior to Admission medications   Medication Sig Start Date End Date Taking? Authorizing Provider  ondansetron (ZOFRAN ODT) 4 MG disintegrating tablet Take 1 tablet (4 mg total) by mouth every 8 (eight) hours as needed for nausea or vomiting. 03/18/21  Yes Lavinia Mcneely, Clent Jacks, MD  allopurinol (ZYLOPRIM) 100 MG tablet Take 1 tablet (100 mg total) by mouth daily. May increase to 2 tablets daily after 1 week if symptoms have not resolved. 10/28/16 10/28/17  Cuthriell, Delorise Royals, PA-C  colchicine 0.6 MG tablet Take 2 tablets (1.2 mg total) by mouth once for 1 dose. If pain persists after 1 hour, take 1  additional pill. No more than 3 pills in 24 hours. 09/05/19 09/05/19  Triplett, Rulon Eisenmenger B, FNP  HYDROcodone-acetaminophen (NORCO/VICODIN) 5-325 MG tablet Take 1 tablet by mouth every 6 (six) hours as needed for moderate pain. 09/05/19   Triplett, Cari B, FNP  predniSONE (STERAPRED UNI-PAK 21 TAB) 10 MG (21) TBPK tablet Take 6 tablets on the first day and decrease by 1 tablet each day until finished. 09/05/19   Chinita Pester, FNP    Allergies Patient has no known allergies.  History reviewed. No pertinent family history.  Social History Social History   Tobacco Use  . Smoking status: Never Smoker  . Smokeless tobacco: Never Used  Substance Use Topics  . Alcohol use: Not Currently  . Drug use: Never    Review of Systems Constitutional: No fever/chills Eyes: No visual changes. ENT: No sore throat. Cardiovascular: Denies chest pain. Respiratory: Denies shortness of breath. Gastrointestinal: No abdominal pain.  Endorses nausea/vomiting/diarrhea Genitourinary: Negative for dysuria. Musculoskeletal: Negative for acute arthralgias Skin: Negative for rash. Neurological: Negative for headaches, weakness/numbness/paresthesias in any extremity Psychiatric: Negative for suicidal ideation/homicidal ideation   ____________________________________________   PHYSICAL EXAM:  VITAL SIGNS: ED Triage Vitals  Enc Vitals Group     BP 03/18/21 0835 (!) 121/93     Pulse Rate 03/18/21 0835 78     Resp 03/18/21 0835 16     Temp 03/18/21 0835 98.3 F (36.8 C)     Temp Source 03/18/21 0835 Oral     SpO2 03/18/21 0835 98 %  Weight 03/18/21 0836 227 lb (103 kg)     Height 03/18/21 0836 5\' 10"  (1.778 m)     Head Circumference --      Peak Flow --      Pain Score 03/18/21 0840 0     Pain Loc --      Pain Edu? --      Excl. in GC? --    Constitutional: Alert and oriented. Well appearing middle-aged African-American male in no acute distress. Eyes: Conjunctivae are normal. PERRL. Head:  Atraumatic. Nose: No congestion/rhinnorhea. Mouth/Throat: Mucous membranes are moist. Neck: No stridor Cardiovascular: Grossly normal heart sounds.  Good peripheral circulation. Respiratory: Normal respiratory effort.  No retractions.  Rhonchi over all lung fields that is cleared after coughing Gastrointestinal: Soft and nontender. No distention. Musculoskeletal: No obvious deformities Neurologic:  Normal speech and language. No gross focal neurologic deficits are appreciated. Skin:  Skin is warm and dry. No rash noted. Psychiatric: Mood and affect are normal. Speech and behavior are normal.  ____________________________________________   LABS (all labs ordered are listed, but only abnormal results are displayed)  Labs Reviewed  LIPASE, BLOOD - Abnormal; Notable for the following components:      Result Value   Lipase 66 (*)    All other components within normal limits  COMPREHENSIVE METABOLIC PANEL - Abnormal; Notable for the following components:   Glucose, Bld 100 (*)    All other components within normal limits  RESP PANEL BY RT-PCR (FLU A&B, COVID) ARPGX2  CBC  URINALYSIS, COMPLETE (UACMP) WITH MICROSCOPIC   PROCEDURES  Procedure(s) performed (including Critical Care):  .1-3 Lead EKG Interpretation Performed by: 05/18/21, MD Authorized by: Merwyn Katos, MD     Interpretation: normal     ECG rate:  67   ECG rate assessment: normal     Rhythm: sinus rhythm     Ectopy: none     Conduction: normal       ____________________________________________   INITIAL IMPRESSION / ASSESSMENT AND PLAN / ED COURSE  As part of my medical decision making, I reviewed the following data within the electronic MEDICAL RECORD NUMBER Nursing notes reviewed and incorporated, Labs reviewed, EKG interpreted, Old chart reviewed, Radiograph reviewed and Notes from prior ED visits reviewed and incorporated        Patient presents for acute nausea/vomiting/diarrhea The cause of  the patients symptoms is not clear, but the patient is overall well appearing and is suspected to have a transient course of illness.  Given History and Exam there does not appear to be an emergent cause of the symptoms such as small bowel obstruction, coronary syndrome, bowel ischemia, DKA, pancreatitis, appendicitis, other acute abdomen or other emergent problem.  Reassessment: After treatment, the patient is feeling much better, tolerating PO fluids, and shows no signs of dehydration.   Disposition: Discharge home with prompt primary care physician follow up in the next 48 hours. Strict return precautions discussed.      ____________________________________________   FINAL CLINICAL IMPRESSION(S) / ED DIAGNOSES  Final diagnoses:  Nausea vomiting and diarrhea  Cough  Chest congestion     ED Discharge Orders         Ordered    ondansetron (ZOFRAN ODT) 4 MG disintegrating tablet  Every 8 hours PRN        03/18/21 1032           Note:  This document was prepared using Dragon voice recognition software and may include unintentional dictation errors.  Merwyn Katos, MD 03/18/21 1034

## 2021-03-18 NOTE — ED Triage Notes (Signed)
Pt arrives via pov from home ambulatory to treatment room. C/o abd pain n/v/d  And cough x 2 days. Pt reports taking covid test at home with negative result. NAD noted at this time

## 2021-03-18 NOTE — ED Notes (Signed)
Pt denies nausea at this time.

## 2021-03-18 NOTE — ED Notes (Signed)
Pt passed water and food challenge

## 2021-05-19 ENCOUNTER — Other Ambulatory Visit: Payer: Self-pay

## 2021-05-19 ENCOUNTER — Emergency Department
Admission: EM | Admit: 2021-05-19 | Discharge: 2021-05-19 | Disposition: A | Discharge status: Home / Self Care | Payer: No Typology Code available for payment source | Attending: Emergency Medicine | Admitting: Emergency Medicine

## 2021-05-19 DIAGNOSIS — M25572 Pain in left ankle and joints of left foot: Secondary | ICD-10-CM | POA: Diagnosis present

## 2021-05-19 DIAGNOSIS — I10 Essential (primary) hypertension: Secondary | ICD-10-CM | POA: Insufficient documentation

## 2021-05-19 DIAGNOSIS — M7662 Achilles tendinitis, left leg: Secondary | ICD-10-CM | POA: Diagnosis not present

## 2021-05-19 MED ORDER — KETOROLAC TROMETHAMINE 30 MG/ML IJ SOLN
30.0000 mg | Freq: Once | INTRAMUSCULAR | Status: AC
  Administered 2021-05-19: 30 mg via INTRAMUSCULAR
  Filled 2021-05-19: qty 1

## 2021-05-19 MED ORDER — INDOMETHACIN 50 MG PO CAPS: 50.0000 mg | ORAL_CAPSULE | Freq: Three times a day (TID) | ORAL | 1 refills | Status: DC

## 2021-05-19 NOTE — ED Provider Notes (Signed)
[facility] Emergency Department Provider Note  ____________________________________________  Time seen: Approximately 2:57 PM  I have reviewed the triage vital signs and the nursing notes.   HISTORY  Chief Complaint Leg Pain    HPI Tony Carlson is a 50 y.o. male who presents the emergency department complaining of posterior left ankle pain.  Patient states that he had a "knot"/swelling develop along the inferior aspect of the Achilles tendon.  Patient states that he is having pain and some swelling to this area.  He does have a history of gout, has had in this ankle before but states that typically this pain is along the anterior joint line or the malleolus is.  Patient denies any calf pain.  He denies any overlying skin changes and no open wounds.  He denied any known injury to the site.       Past Medical History:  Diagnosis Date   Gout    has presented separately but in multiple different joints in the past   Hypertension     There are no problems to display for this patient.   History reviewed. No pertinent surgical history.  Prior to Admission medications   Medication Sig Start Date End Date Taking? Authorizing Provider  indomethacin (INDOCIN) 50 MG capsule Take 1 capsule (50 mg total) by mouth 3 (three) times daily with meals. 05/19/21  Yes Jiana Lemaire, Delorise Royals, PA-C  ondansetron (ZOFRAN ODT) 4 MG disintegrating tablet Take 1 tablet (4 mg total) by mouth every 8 (eight) hours as needed for nausea or vomiting. 03/18/21   Merwyn Katos, MD    Allergies Patient has no known allergies.  No family history on file.  Social History Social History   Tobacco Use   Smoking status: Never   Smokeless tobacco: Never  Substance Use Topics   Alcohol use: Not Currently   Drug use: Never     Review of Systems  Constitutional: No fever/chills Eyes: No visual changes. No discharge ENT: No upper respiratory complaints. Cardiovascular: no  chest pain. Respiratory: no cough. No SOB. Gastrointestinal: No abdominal pain.  No nausea, no vomiting.  No diarrhea.  No constipation. Musculoskeletal: Positive for pain to the posterior ankle left side Skin: Negative for rash, abrasions, lacerations, ecchymosis. Neurological: Negative for headaches, focal weakness or numbness.  10 System ROS otherwise negative.  ____________________________________________   PHYSICAL EXAM:  VITAL SIGNS: ED Triage Vitals  Enc Vitals Group     BP 05/19/21 1349 (!) 150/89     Pulse Rate 05/19/21 1349 63     Resp 05/19/21 1349 18     Temp 05/19/21 1349 98 F (36.7 C)     Temp src --      SpO2 05/19/21 1349 97 %     Weight 05/19/21 1450 227 lb 1.2 oz (103 kg)     Height 05/19/21 1450 5\' 10"  (1.778 m)     Head Circumference --      Peak Flow --      Pain Score 05/19/21 1327 6     Pain Loc --      Pain Edu? --      Excl. in [place]? --      Constitutional: Alert and oriented. Well appearing and in no acute distress. Eyes: Conjunctivae are normal. PERRL. EOMI. Head: Atraumatic. ENT:      Ears:       Nose: No congestion/rhinnorhea.      Mouth/Throat: Mucous membranes are moist.  Neck: No stridor.  Cardiovascular: Normal rate, regular rhythm. Normal S1 and S2.  Good peripheral circulation. Respiratory: Normal respiratory effort without tachypnea or retractions. Lungs CTAB. Good air entry to the bases with no decreased or absent breath sounds. Musculoskeletal: Full range of motion to all extremities. No gross deformities appreciated.  Visualization of the left ankle reveals minimal edema about the distal aspect of the Achilles tendon.  There is no other visible abnormalities about the ankle joint.  Palpation reveals tenderness over the distal aspect of the Achilles tendon with no deficit.  No other tenderness over the ankle joint.  No tenderness extending into the calf.  There is no warmth to palpation.  No ballottement. Neurologic:  Normal speech  and language. No gross focal neurologic deficits are appreciated.  Skin:  Skin is warm, dry and intact. No rash noted. Psychiatric: Mood and affect are normal. Speech and behavior are normal. Patient exhibits appropriate insight and judgement.   ____________________________________________   LABS (all labs ordered are listed, but only abnormal results are displayed)  Labs Reviewed - No data to display ____________________________________________  EKG   ____________________________________________  RADIOLOGY   No results found.  ____________________________________________    PROCEDURES  Procedure(s) performed:    Procedures    Medications  ketorolac (TORADOL) 30 MG/ML injection 30 mg (30 mg Intramuscular Given 05/19/21 1619)     ____________________________________________   INITIAL IMPRESSION / ASSESSMENT AND PLAN / ED COURSE  Pertinent labs & imaging results that were available during my care of the patient were reviewed by me and considered in my medical decision making (see chart for details).  Review of the [place] CSRS was performed in accordance of the [facility] prior to dispensing any controlled drugs.           Patient's diagnosis is consistent with Achilles tendinitis.  Patient presents the emergency department pain along the distal aspect of the Achilles tendon.  No known injury.  Palpation revealed no concern for Achilles rupture.  Patient does have a history of gout and has had gout in this ankle in the past.  Patient states that typically the pain is along the anterior joint line and over the malleolus is.  There is no on currently.  Findings are consistent with Achilles tendinitis.  I will treat the patient with Toradol and Indocin at home.  This will cover both gout and Achilles tendinitis.  ASO lace up stirrup ankle brace for symptom improvement.  Follow-up with orthopedics as needed. Patient is given ED precautions to return to the ED for any worsening or  new symptoms.     ____________________________________________  FINAL CLINICAL IMPRESSION(S) / ED DIAGNOSES  Final diagnoses:  Achilles tendinitis of left lower extremity      NEW MEDICATIONS STARTED DURING THIS VISIT:  ED Discharge Orders          Ordered    indomethacin (INDOCIN) 50 MG capsule  3 times daily with meals        05/19/21 1626                This chart was dictated using voice recognition software/Dragon. Despite best efforts to proofread, errors can occur which can change the meaning. Any change was purely unintentional.    Racheal Patches, PA-C 05/19/21 1626    Merwyn Katos, MD 05/19/21 1932

## 2021-05-19 NOTE — ED Notes (Signed)
Pt reports used to be on BP meds but was taken off them as he controlled it in other ways. States takes BP at home regularly as was 120/70 in last few days. States thinks it will dec back to normal as his pain gets under control. Pt sitting calmly in bed; face relaxed; resp reg/unlabored; skin dry. Offered wheelchair out to vehicle but refused.

## 2021-05-19 NOTE — ED Triage Notes (Signed)
Pt comes with c/o left leg pain. Pt states this started yesterday. Pt states no known injury.

## 2021-05-19 NOTE — ED Notes (Signed)
See triage note  Presents with pain to left posterior ankle since yesterday  States he has a hx of gout and feels the same

## 2021-11-16 ENCOUNTER — Emergency Department: Payer: No Typology Code available for payment source

## 2021-11-16 ENCOUNTER — Emergency Department
Admission: EM | Admit: 2021-11-16 | Discharge: 2021-11-16 | Disposition: A | Discharge status: Home / Self Care | Payer: No Typology Code available for payment source | Attending: Emergency Medicine | Admitting: Emergency Medicine

## 2021-11-16 ENCOUNTER — Other Ambulatory Visit: Payer: Self-pay

## 2021-11-16 DIAGNOSIS — M10072 Idiopathic gout, left ankle and foot: Secondary | ICD-10-CM | POA: Insufficient documentation

## 2021-11-16 DIAGNOSIS — M79672 Pain in left foot: Secondary | ICD-10-CM | POA: Diagnosis present

## 2021-11-16 LAB — CBC WITH DIFFERENTIAL/PLATELET
Abs Immature Granulocytes: 0.05 10*3/uL (ref 0.00–0.07)
Basophils Absolute: 0.1 10*3/uL (ref 0.0–0.1)
Basophils Relative: 1 %
Eosinophils Absolute: 0.4 10*3/uL (ref 0.0–0.5)
Eosinophils Relative: 4 %
HCT: 47 % (ref 39.0–52.0)
Hemoglobin: 15.9 g/dL (ref 13.0–17.0)
Immature Granulocytes: 1 %
Lymphocytes Relative: 30 %
Lymphs Abs: 3 10*3/uL (ref 0.7–4.0)
MCH: 29.9 pg (ref 26.0–34.0)
MCHC: 33.8 g/dL (ref 30.0–36.0)
MCV: 88.3 fL (ref 80.0–100.0)
Monocytes Absolute: 0.9 10*3/uL (ref 0.1–1.0)
Monocytes Relative: 9 %
Neutro Abs: 5.6 10*3/uL (ref 1.7–7.7)
Neutrophils Relative %: 55 %
Platelets: 223 10*3/uL (ref 150–400)
RBC: 5.32 MIL/uL (ref 4.22–5.81)
RDW: 13.5 % (ref 11.5–15.5)
WBC: 10 10*3/uL (ref 4.0–10.5)
nRBC: 0 % (ref 0.0–0.2)

## 2021-11-16 LAB — URIC ACID: Uric Acid, Serum: 5.9 mg/dL (ref 3.7–8.6)

## 2021-11-16 MED ORDER — INDOMETHACIN 50 MG PO CAPS: 50.0000 mg | ORAL_CAPSULE | Freq: Three times a day (TID) | ORAL | 1 refills | Status: AC

## 2021-11-16 MED ORDER — HYDROCODONE-ACETAMINOPHEN 5-325 MG PO TABS: 1.0000 | ORAL_TABLET | ORAL | 0 refills | Status: DC | PRN

## 2021-11-16 MED ORDER — PREDNISONE 10 MG (21) PO TBPK: ORAL_TABLET | ORAL | 0 refills | Status: AC

## 2021-11-16 NOTE — ED Provider Notes (Signed)
[facility] Provider Note    None    (approximate)   History   foot pain   HPI Tony Carlson is a 51 y.o. male with a stated past medical history of gout who presents for left dorsal foot pain that is been worsening over the last 3 days.  Patient states that this pain is similar to his previous gout flares including burning, nonradiating pain that is worse with any ambulation and partially relieved at rest and with high-dose ibuprofen.  Patient states that he was unable to get into see his primary doctor and therefore presented to the emergency department.     Physical Exam   Triage Vital Signs: ED Triage Vitals  Enc Vitals Group     BP 11/16/21 0627 136/90     Pulse Rate 11/16/21 0627 74     Resp 11/16/21 0627 16     Temp 11/16/21 0627 98 F (36.7 C)     Temp Source 11/16/21 0627 Oral     SpO2 11/16/21 0627 98 %     Weight 11/16/21 0628 240 lb (108.9 kg)     Height 11/16/21 0628 5\' 10"  (1.778 m)     Head Circumference --      Peak Flow --      Pain Score 11/16/21 0627 6     Pain Loc --      Pain Edu? --      Excl. in [place]? --     Most recent vital signs: Vitals:   11/16/21 0627  BP: 136/90  Pulse: 74  Resp: 16  Temp: 98 F (36.7 C)  SpO2: 98%    General: Awake, no distress.  CV:  Good peripheral perfusion.  Resp:  Normal effort.  Abd:  No distention.  Other:  Erythema and significant tenderness to palpation over the dorsum of the left foot   ED Results / Procedures / Treatments   Labs (all labs ordered are listed, but only abnormal results are displayed) Labs Reviewed  CBC WITH DIFFERENTIAL/PLATELET  URIC ACID   RADIOLOGY  ED MD interpretation: Three-view x-ray of the left foot shows no evidence of acute arthropathy or other focal bony abnormalities and soft tissues are unremarkable.  Agree with radiology assessment  Official radiology report(s): DG Foot Complete Left  Result Date: 11/16/2021 CLINICAL DATA:  Pain and  redness. EXAM: LEFT FOOT - COMPLETE 3+ VIEW COMPARISON:  None. FINDINGS: There is no evidence of fracture or dislocation. There is no evidence of arthropathy or other focal bone abnormality. Soft tissues are unremarkable. IMPRESSION: Negative. Electronically Signed   By: Misty Stanley M.D.   On: 11/16/2021 06:54      PROCEDURES:  Critical Care performed: No  Procedures   MEDICATIONS ORDERED IN ED: Medications - No data to display   IMPRESSION / MDM / [place] / ED COURSE  I reviewed the triage vital signs and the nursing notes.                              Differential diagnosis includes, but is not limited to, fracture, dislocation, significant ligamentous injury, septic arthritis, gout flare, new autoimmune arthropathy, or gonococcal arthropathy.  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  In reviewing his primary physician, Dr. Ola Spurr, old notes (06/19/20) patient  has been treated with indomethacin for gout flares in the past and states that this treatment works appropriately.  Patient's uric acid was checked here today and was within normal limits however it is reasonable to treat for a gout flare nonetheless given patient's symptoms and physical exam Rx: Prednisone Dosepak, indomethacin Disposition: Discharge home with strict return precautions and instructions for prompt primary care follow up in the next week.      FINAL CLINICAL IMPRESSION(S) / ED DIAGNOSES   Final diagnoses:  Acute idiopathic gout of left foot     Rx / DC Orders   ED Discharge Orders          Ordered    indomethacin (INDOCIN) 50 MG capsule  3 times daily with meals        11/16/21 0820    predniSONE (STERAPRED UNI-PAK 21 TAB) 10 MG (21) TBPK tablet        11/16/21 0820    HYDROcodone-acetaminophen (NORCO) 5-325 MG tablet  Every 4 hours PRN        11/16/21 0820              Note:  This document was prepared using Dragon voice recognition software and may include unintentional dictation errors.   Naaman Plummer, MD 11/16/21 0830

## 2021-11-16 NOTE — ED Notes (Signed)
Pt d/c by EDP.

## 2021-11-16 NOTE — ED Triage Notes (Signed)
Pt with left foot pain for several days. Pt denies known fever, pt states history of gout, states pain feels similar to gout in past. Slight redness note to top of foot.

## 2023-06-18 ENCOUNTER — Emergency Department
Admission: EM | Admit: 2023-06-18 | Discharge: 2023-06-18 | Disposition: A | Discharge status: Home / Self Care | Payer: No Typology Code available for payment source | Attending: Emergency Medicine | Admitting: Emergency Medicine

## 2023-06-18 ENCOUNTER — Other Ambulatory Visit: Payer: Self-pay

## 2023-06-18 DIAGNOSIS — M25571 Pain in right ankle and joints of right foot: Secondary | ICD-10-CM | POA: Diagnosis present

## 2023-06-18 DIAGNOSIS — M7661 Achilles tendinitis, right leg: Secondary | ICD-10-CM

## 2023-06-18 DIAGNOSIS — R030 Elevated blood-pressure reading, without diagnosis of hypertension: Secondary | ICD-10-CM | POA: Insufficient documentation

## 2023-06-18 DIAGNOSIS — I10 Essential (primary) hypertension: Secondary | ICD-10-CM

## 2023-06-18 MED ORDER — NAPROXEN 500 MG PO TABS: 500.0000 mg | ORAL_TABLET | Freq: Two times a day (BID) | ORAL | 0 refills | Status: AC

## 2023-06-18 NOTE — Discharge Instructions (Addendum)
You may use the boot for protection while you are on your feet.  Apply ice to the area 20 minutes on, 20 minutes off multiple times per day.  You may also take the naproxen to help with inflammation, though remember do not take this with any other NSAIDs.  Please follow-up with orthopedics if your symptoms persist.  Also, you are found to have a very elevated blood pressure.  Please follow-up with your outpatient provider this week for blood pressure recheck and reinitiation of your lisinopril if your primary care provider deems this appropriate.  Please return for any new, worsening, or change in symptoms or other concerns. It was a pleasure caring for you today.

## 2023-06-18 NOTE — ED Provider Notes (Signed)
[facility] Provider Note    Event Date/Time   First MD Initiated Contact with Patient 06/18/23 [phone]     (approximate)   History   Ankle Pain   HPI  Tony Carlson is a 52 y.o. male who presents today for evaluation of right sided Achilles tendon inflammation.  Patient reports that he recently started exercising again and was doing walking on the treadmill and the next day he felt pain and mild swelling along his Achilles tendon.  He reports that this has happened to him on the other side.  He denies calf pain.  He reports that he still able to ambulate.  He reports that he has had gout in the past, however his gout is normally in his toe and his ankle and this feels different.  I noted that he also has an elevation in his blood pressure, patient reports that he had stopped taking his lisinopril because he had lost weight and was exercising more and his blood pressure came down, but given his busy life right now he has gained weight and also stopped exercising.  He has not taken his lisinopril recently but has "a whole bunch" at home and does not wish to be started on new blood pressure medicine.  He denies chest pain, shortness of breath, headache, visual changes.  There are no problems to display for this patient.         Physical Exam   Triage Vital Signs: ED Triage Vitals  Encounter Vitals Group     BP 06/18/23 0705 (!) 179/119     Systolic BP Percentile --      Diastolic BP Percentile --      Pulse Rate 06/18/23 0705 91     Resp 06/18/23 0705 18     Temp 06/18/23 0705 98.3 F (36.8 C)     Temp Source 06/18/23 0705 Oral     SpO2 06/18/23 0705 96 %     Weight 06/18/23 0704 262 lb (118.8 kg)     Height 06/18/23 0704 5\' 10"  (1.778 m)     Head Circumference --      Peak Flow --      Pain Score 06/18/23 0704 6     Pain Loc --      Pain Education --      Exclude from Growth Chart --     Most recent vital signs: Vitals:   06/18/23 0705   BP: (!) 179/119  Pulse: 91  Resp: 18  Temp: 98.3 F (36.8 C)  SpO2: 96%    Physical Exam Vitals and nursing note reviewed.  Constitutional:      General: Awake and alert. No acute distress.    Appearance: Normal appearance. The patient is obese.  HENT:     Head: Normocephalic and atraumatic.     Mouth: Mucous membranes are moist.  Eyes:     General: PERRL. Normal EOMs        Right eye: No discharge.        Left eye: No discharge.     Conjunctiva/sclera: Conjunctivae normal.  Cardiovascular:     Rate and Rhythm: Normal rate and regular rhythm.     Pulses: Normal pulses.  Pulmonary:     Effort: Pulmonary effort is normal. No respiratory distress.     Breath sounds: Normal breath sounds.  Abdominal:     Abdomen is soft. There is no abdominal tenderness. No rebound or guarding. No distention. Musculoskeletal:  General: No swelling. Normal range of motion.     Cervical back: Normal range of motion and neck supple.  Right ankle: No deformities.  He has tenderness over the distal aspect of the Achilles tendon with a focal area of swelling.  Negative Thompson test.  Able to plantarflex and dorsiflex against resistance.  He has no medial or lateral malleoli or tenderness.  Normal pedal pulse.  Sensation intact light touch throughout.  No calf tenderness.  No warmth.  No edema.  No wounds.  No erythema. Skin:    General: Skin is warm and dry.     Capillary Refill: Capillary refill takes less than 2 seconds.     Findings: No rash.  Neurological:     Mental Status: The patient is awake and alert.      ED Results / Procedures / Treatments   Labs (all labs ordered are listed, but only abnormal results are displayed) Labs Reviewed - No data to display   EKG     RADIOLOGY     PROCEDURES:  Critical Care performed:   Procedures   MEDICATIONS ORDERED IN ED: Medications - No data to display   IMPRESSION / MDM / ASSESSMENT AND PLAN / ED COURSE  I reviewed the  triage vital signs and the nursing notes.   Differential diagnosis includes, but is not limited to, Achilles tendinitis, Achilles tendon rupture, ankle sprain.  Patient is awake and alert, hemodynamically stable and afebrile.  He is neurovascularly intact.  He has tenderness with mild swelling over the Achilles tendon consistent with Achilles tendinitis.  He is able to plantarflex with normal strength against resistance, negative Thompson test, do not suspect a complete Achilles tendon rupture.  He is still able to ambulate.  We discussed the importance of rest, ice, elevation.  He was given a cam boot with heel lift for protection of his Achilles tendon.  He was also given a prescription for naproxen, advised not to take this with other NSAIDs.  There are no clinical signs or symptoms of infection at this time.  He was advised to follow-up with orthopedics and the appropriate follow-up information was provided.  We also discussed his elevated blood pressure.  Currently this is asymptomatic hypertension.  Patient declined antihypertensive medicine in the emergency department.  He reports that he used to be on lisinopril, though was taken off of it once he lost weight and began exercising.  He declines new prescription for this today, reports that he has "a whole bunch" at home.  We discussed the risks of elevated blood pressure.  He was advised to follow-up this week with his outpatient provider for blood pressure recheck and to reinitiate his blood pressure medication as recommended by his PCP.  We also discussed return precautions.  Patient understands and agrees with plan.  He was discharged in stable condition.   Patient's presentation is most consistent with acute complicated illness / injury requiring diagnostic workup.    FINAL CLINICAL IMPRESSION(S) / ED DIAGNOSES   Final diagnoses:  Achilles tendinitis of right lower extremity  Elevated blood pressure reading in office with diagnosis of  hypertension     Rx / DC Orders   ED Discharge Orders          Ordered    naproxen (NAPROSYN) 500 MG tablet  2 times daily with meals        06/18/23 0724             Note:  This document was  prepared using Conservation officer, historic buildings and may include unintentional dictation errors.   Jackelyn Hoehn, PA-C 06/18/23 1335    Corena Herter, MD 06/18/23 [phone]

## 2023-06-18 NOTE — ED Triage Notes (Signed)
R ankle pain and foot pain. Swelling noted to achilles. Reports onset after being on the treadmill 3 days ago. Did not feel any pop or other obvious injury at the time. No relief with R.I.C.E or tylenol/motrin at home. Pt ambulatory to triage. Alert and oriented. Breathing unlabored

## 2023-09-22 ENCOUNTER — Encounter: Payer: Self-pay | Admitting: Emergency Medicine

## 2023-09-22 ENCOUNTER — Other Ambulatory Visit: Payer: Self-pay

## 2023-09-22 ENCOUNTER — Emergency Department
Admission: EM | Admit: 2023-09-22 | Discharge: 2023-09-22 | Disposition: A | Discharge status: Home / Self Care | Payer: Self-pay | Attending: Emergency Medicine | Admitting: Emergency Medicine

## 2023-09-22 ENCOUNTER — Emergency Department: Payer: Self-pay

## 2023-09-22 DIAGNOSIS — M769 Unspecified enthesopathy, lower limb, excluding foot: Secondary | ICD-10-CM | POA: Insufficient documentation

## 2023-09-22 DIAGNOSIS — M779 Enthesopathy, unspecified: Secondary | ICD-10-CM

## 2023-09-22 DIAGNOSIS — L03116 Cellulitis of left lower limb: Secondary | ICD-10-CM | POA: Insufficient documentation

## 2023-09-22 DIAGNOSIS — M7989 Other specified soft tissue disorders: Secondary | ICD-10-CM

## 2023-09-22 DIAGNOSIS — I10 Essential (primary) hypertension: Secondary | ICD-10-CM | POA: Insufficient documentation

## 2023-09-22 LAB — BASIC METABOLIC PANEL
Anion gap: 11 (ref 5–15)
BUN: 13 mg/dL (ref 6–20)
CO2: 20 mmol/L — ABNORMAL LOW (ref 22–32)
Calcium: 9.4 mg/dL (ref 8.9–10.3)
Chloride: 102 mmol/L (ref 98–111)
Creatinine, Ser: 0.83 mg/dL (ref 0.61–1.24)
GFR, Estimated: >60 mL/min (ref >60)
Glucose, Bld: 104 mg/dL — ABNORMAL HIGH (ref 70–99)
Potassium: 3.9 mmol/L (ref 3.5–5.1)
Sodium: 133 mmol/L — ABNORMAL LOW (ref 135–145)

## 2023-09-22 LAB — CBC WITH DIFFERENTIAL/PLATELET
Abs Immature Granulocytes: 0.03 10*3/uL (ref 0.00–0.07)
Basophils Absolute: 0.1 10*3/uL (ref 0.0–0.1)
Basophils Relative: 1 %
Eosinophils Absolute: 0.2 10*3/uL (ref 0.0–0.5)
Eosinophils Relative: 2 %
HCT: 45.8 % (ref 39.0–52.0)
Hemoglobin: 16 g/dL (ref 13.0–17.0)
Immature Granulocytes: 0 %
Lymphocytes Relative: 19 %
Lymphs Abs: 1.8 10*3/uL (ref 0.7–4.0)
MCH: 32.1 pg (ref 26.0–34.0)
MCHC: 34.9 g/dL (ref 30.0–36.0)
MCV: 92 fL (ref 80.0–100.0)
Monocytes Absolute: 0.8 10*3/uL (ref 0.1–1.0)
Monocytes Relative: 9 %
Neutro Abs: 6.7 10*3/uL (ref 1.7–7.7)
Neutrophils Relative %: 69 %
Platelets: 207 10*3/uL (ref 150–400)
RBC: 4.98 MIL/uL (ref 4.22–5.81)
RDW: 13.2 % (ref 11.5–15.5)
WBC: 9.6 10*3/uL (ref 4.0–10.5)
nRBC: 0 % (ref 0.0–0.2)

## 2023-09-22 LAB — URIC ACID: Uric Acid, Serum: 6.3 mg/dL (ref 3.7–8.6)

## 2023-09-22 MED ORDER — KETOROLAC TROMETHAMINE 15 MG/ML IJ SOLN
15.0000 mg | Freq: Once | INTRAMUSCULAR | Status: AC
  Administered 2023-09-22: 15 mg via INTRAMUSCULAR
  Filled 2023-09-22: qty 1

## 2023-09-22 MED ORDER — CEPHALEXIN 500 MG PO CAPS: 500.0000 mg | ORAL_CAPSULE | Freq: Three times a day (TID) | ORAL | 0 refills | Status: AC

## 2023-09-22 MED ORDER — DICLOFENAC SODIUM 1 % EX GEL: 4.0000 g | Freq: Four times a day (QID) | CUTANEOUS | Status: AC

## 2023-09-22 MED ORDER — SODIUM CHLORIDE 0.9 % IV SOLN
1.0000 g | Freq: Once | INTRAVENOUS | Status: AC
  Administered 2023-09-22: 1 g via INTRAVENOUS
  Filled 2023-09-22: qty 10

## 2023-09-22 MED ORDER — DICLOFENAC SODIUM 1 % EX GEL: 4.0000 g | Freq: Four times a day (QID) | CUTANEOUS | Status: DC

## 2023-09-22 NOTE — ED Provider Notes (Signed)
[facility] Provider Note    Event Date/Time   First MD Initiated Contact with Patient 09/22/23 1230     (approximate)   History   Leg Swelling   HPI  Tony Carlson is a 52 y.o. male with history of gout and hypertension presents emergency department complaining of left lower leg swelling.  States noted redness and warmth around the ankle.  Pain is at the back of the leg along the Achilles tendon.  Painful range of motion.  States swelling has gotten worse.  Symptoms been ongoing for 2 days.  No history of DVTs.  No chest pain or shortness of breath.      Physical Exam   Triage Vital Signs: ED Triage Vitals  Encounter Vitals Group     BP 09/22/23 1203 (!) 164/102     Systolic BP Percentile --      Diastolic BP Percentile --      Pulse Rate 09/22/23 1203 97     Resp 09/22/23 1203 18     Temp 09/22/23 1203 97.7 F (36.5 C)     Temp Source 09/22/23 1203 Oral     SpO2 09/22/23 1203 97 %     Weight 09/22/23 1201 230 lb (104.3 kg)     Height 09/22/23 1201 5\' 10"  (1.778 m)     Head Circumference --      Peak Flow --      Pain Score 09/22/23 1201 8     Pain Loc --      Pain Education --      Exclude from Growth Chart --     Most recent vital signs: Vitals:   09/22/23 1203  BP: (!) 164/102  Pulse: 97  Resp: 18  Temp: 97.7 F (36.5 C)  SpO2: 97%     General: Awake, no distress.   CV:  Good peripheral perfusion. regular rate and  rhythm Resp:  Normal effort.  Abd:  No distention.   Other:  Left lower extremity with warmth and tenderness noted around the ankle and Achilles, tender into the calf, neurovascular is intact, decreased range of motion secondary discomfort   ED Results / Procedures / Treatments   Labs (all labs ordered are listed, but only abnormal results are displayed) Labs Reviewed  BASIC METABOLIC PANEL - Abnormal; Notable for the following components:      Result Value   Sodium 133 (*)    CO2 20 (*)    Glucose,  Bld 104 (*)    All other components within normal limits  CBC WITH DIFFERENTIAL/PLATELET  URIC ACID     EKG     RADIOLOGY Ultrasound left lower extremity    PROCEDURES:   Procedures   MEDICATIONS ORDERED IN ED: Medications  ketorolac (TORADOL) 15 MG/ML injection 15 mg (15 mg Intramuscular Given 09/22/23 1253)  cefTRIAXone (ROCEPHIN) 1 g in sodium chloride 0.9 % 100 mL IVPB (0 g Intravenous Stopped 09/22/23 1553)     IMPRESSION / MDM / ASSESSMENT AND PLAN / ED COURSE  I reviewed the triage vital signs and the nursing notes.                              Differential diagnosis includes, but is not limited to, gout, cellulitis, tendinitis, DVT  Patient's presentation is most consistent with acute illness / injury with system symptoms.   Ultrasound left lower extremity, lab work ordered  Patient given Toradol  15 mg IM for pain   Ultrasound left lower extremity, did independently review interpret the radiologist reading for being negative for DVT  Labs are reassuring  X-ray of the left ankle independently reviewed interpreted by me as being negative for any acute abnormality  Due to the redness and swelling along the leg I will go ahead and treat him for cellulitis, have concerns of a tendinitis also, he is placed in a cam walker to support the Achilles tendon.  He is to follow-up with podiatry.  Also given a prescription for Keflex 500 3 times daily for 7 days.  Diclofenac 1% gel to apply to the posterior left ankle leg.  He is to ice and elevate.  Follow-up with podiatry and strict instructions to return the emergency department if worsening.  Patient is in agreement treatment plan.  He was discharged stable condition.   FINAL CLINICAL IMPRESSION(S) / ED DIAGNOSES   Final diagnoses:  Leg swelling  Cellulitis of left lower extremity  Tendonitis     Rx / DC Orders   ED Discharge Orders          Ordered    cephALEXin (KEFLEX) 500 MG capsule  3 times daily         09/22/23 1456    diclofenac Sodium (VOLTAREN) 1 % GEL  4 times daily,   Status:  Discontinued        09/22/23 1456    diclofenac Sodium (VOLTAREN) 1 % GEL  4 times daily        09/22/23 1456             Note:  This document was prepared using Dragon voice recognition software and may include unintentional dictation errors.    Faythe Ghee, PA-C 09/22/23 Domingo Sep, MD 09/23/23 [phone]

## 2023-09-22 NOTE — ED Triage Notes (Signed)
Patient to ED via POV for left leg swelling. Swelling and redness noted- no known injury. Ongoing x2 days. Hx of tendonitis

## 2023-12-02 IMAGING — CR DG FOOT COMPLETE 3+V*L*
1 series · 3 of 3 positions shown · non-contrast
Comparison: None.

CLINICAL DATA: Pain and redness.

EXAM:
LEFT FOOT - COMPLETE 3+ VIEW

[Series 1: dg foot complete left · 0.14mm/px · 3 of 3 slices shown]
[im 1/3]
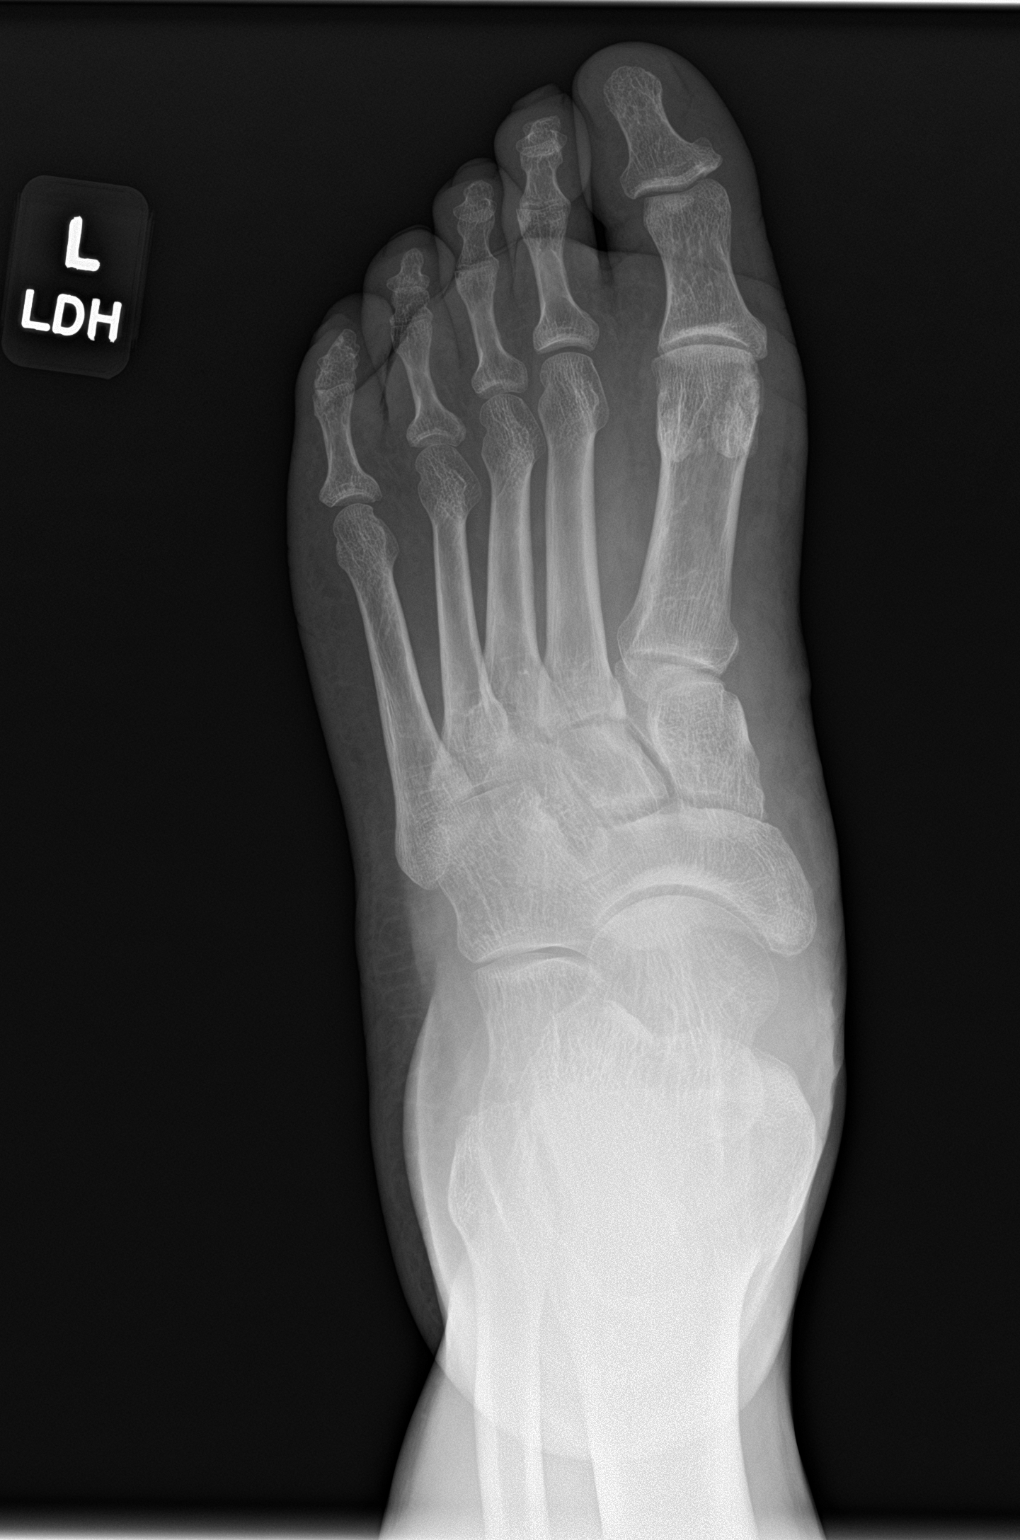
[im 2/3]
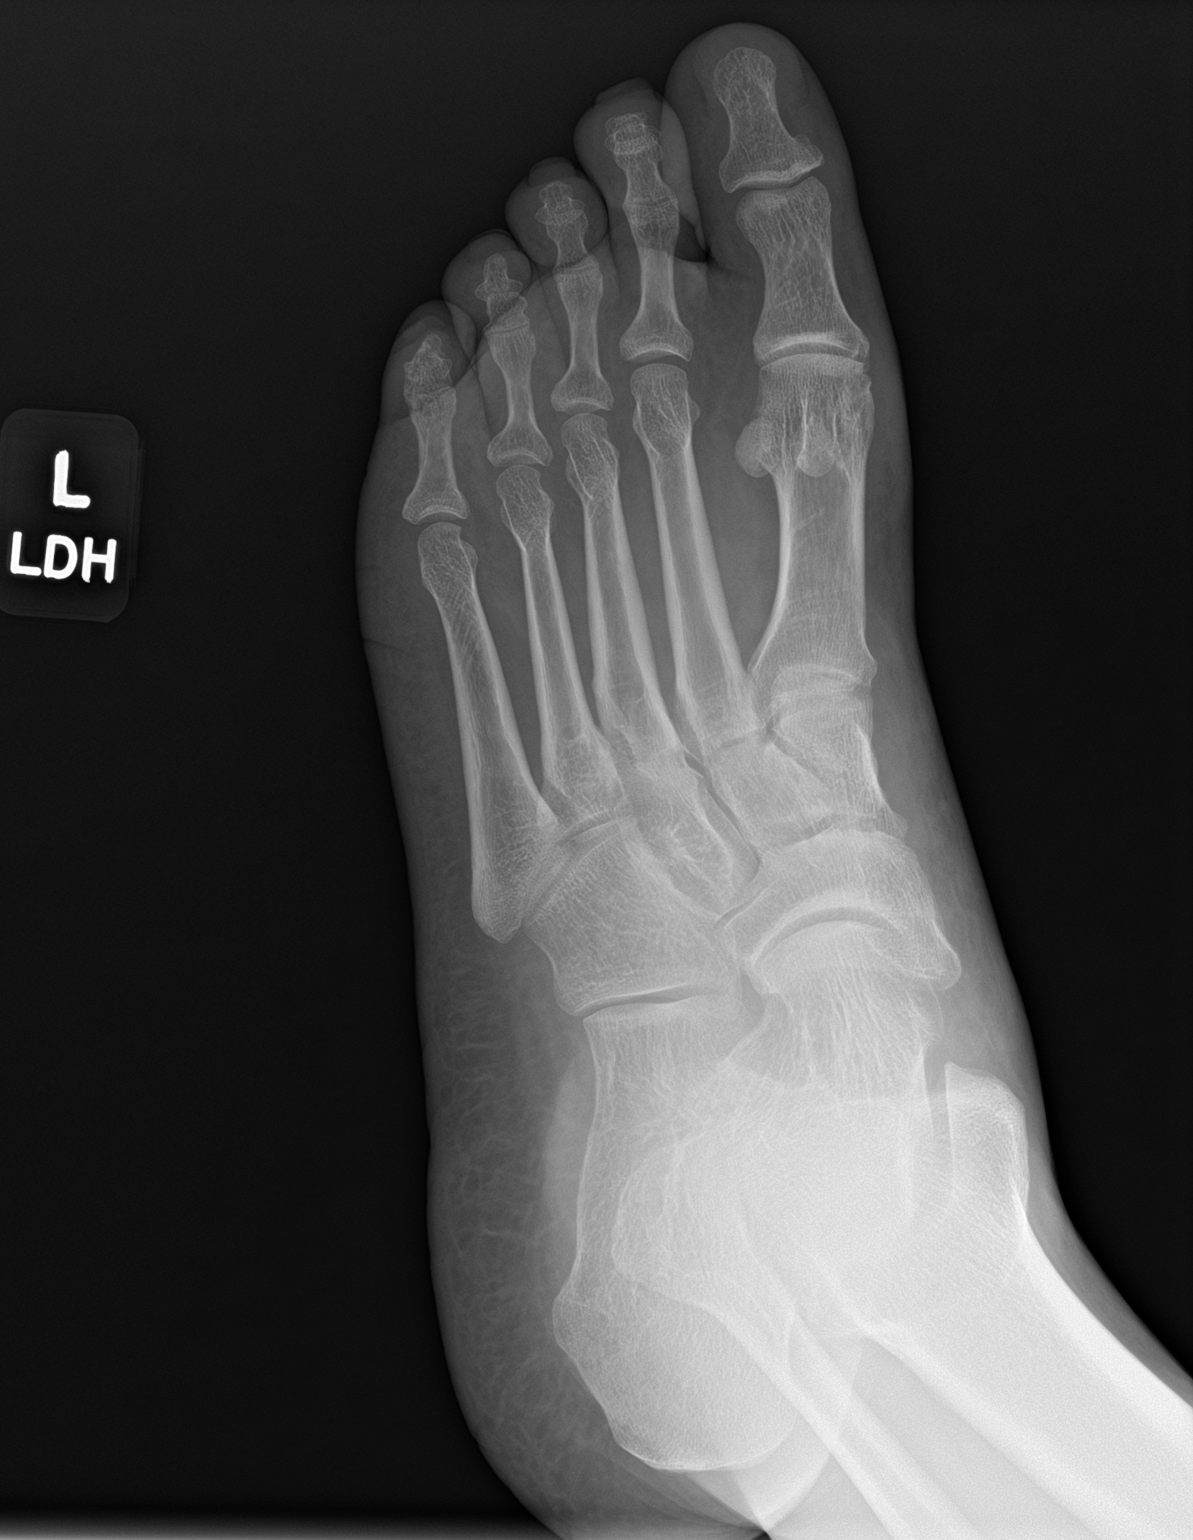
[im 3/3]
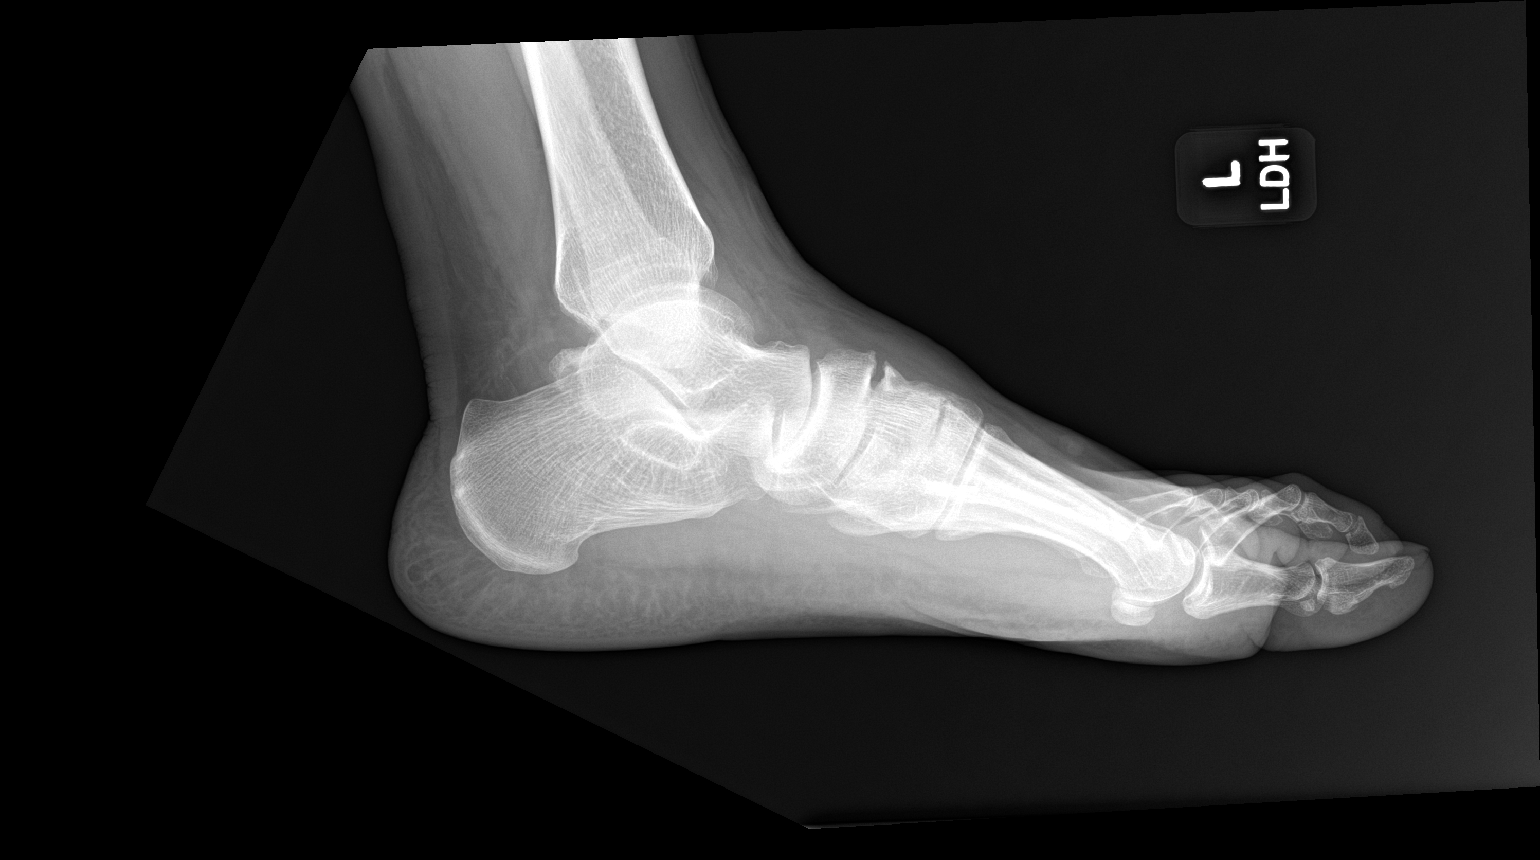

[3 of 3 positions shown; findings below may reference images not displayed]

FINDINGS: There is no evidence of fracture or dislocation. There is no
evidence of arthropathy or other focal bone abnormality. Soft
tissues are unremarkable.
IMPRESSION: Negative.
